# Patient Record
Sex: Female | Born: 1970 | Race: White | Hispanic: No | Smoking: Never smoker
Health system: Southern US, Community
[De-identification: ages and names within clinical notes are randomized; demographics above are authoritative.]

## PROBLEM LIST (undated history)

## (undated) DIAGNOSIS — R011 Cardiac murmur, unspecified: Secondary | ICD-10-CM

## (undated) DIAGNOSIS — I82409 Acute embolism and thrombosis of unspecified deep veins of unspecified lower extremity: Secondary | ICD-10-CM

## (undated) DIAGNOSIS — D649 Anemia, unspecified: Secondary | ICD-10-CM

## (undated) DIAGNOSIS — D219 Benign neoplasm of connective and other soft tissue, unspecified: Secondary | ICD-10-CM

## (undated) HISTORY — PX: SHOULDER SURGERY: SHX246

## (undated) HISTORY — PX: BLADDER SUSPENSION: SHX72

## (undated) HISTORY — PX: LEEP: SHX91

---

## 1998-03-15 ENCOUNTER — Inpatient Hospital Stay (HOSPITAL_COMMUNITY): Admission: AD | Admit: 1998-03-15 | Discharge: 1998-03-16 | Payer: Self-pay | Admitting: *Deleted

## 2002-02-02 ENCOUNTER — Other Ambulatory Visit: Admission: RE | Admit: 2002-02-02 | Discharge: 2002-02-02 | Payer: Self-pay | Admitting: Obstetrics and Gynecology

## 2002-10-30 ENCOUNTER — Other Ambulatory Visit: Admission: RE | Admit: 2002-10-30 | Discharge: 2002-10-30 | Payer: Self-pay | Admitting: Obstetrics and Gynecology

## 2003-06-28 ENCOUNTER — Encounter: Admission: RE | Admit: 2003-06-28 | Discharge: 2003-06-28 | Payer: Self-pay | Admitting: Obstetrics and Gynecology

## 2003-07-04 ENCOUNTER — Encounter: Admission: RE | Admit: 2003-07-04 | Discharge: 2003-07-04 | Payer: Self-pay | Admitting: Obstetrics and Gynecology

## 2003-07-08 ENCOUNTER — Ambulatory Visit (HOSPITAL_COMMUNITY): Admission: RE | Admit: 2003-07-08 | Discharge: 2003-07-08 | Payer: Self-pay | Admitting: Obstetrics and Gynecology

## 2003-07-08 ENCOUNTER — Inpatient Hospital Stay (HOSPITAL_COMMUNITY): Admission: AD | Admit: 2003-07-08 | Discharge: 2003-07-12 | Payer: Self-pay | Admitting: Obstetrics and Gynecology

## 2003-07-08 ENCOUNTER — Encounter: Payer: Self-pay | Admitting: Obstetrics and Gynecology

## 2003-07-12 ENCOUNTER — Encounter: Admission: RE | Admit: 2003-07-12 | Discharge: 2003-08-11 | Payer: Self-pay | Admitting: Obstetrics and Gynecology

## 2003-08-12 ENCOUNTER — Encounter: Admission: RE | Admit: 2003-08-12 | Discharge: 2003-09-11 | Payer: Self-pay | Admitting: Obstetrics and Gynecology

## 2003-10-12 ENCOUNTER — Encounter: Admission: RE | Admit: 2003-10-12 | Discharge: 2003-11-11 | Payer: Self-pay | Admitting: Obstetrics and Gynecology

## 2003-11-07 ENCOUNTER — Other Ambulatory Visit: Admission: RE | Admit: 2003-11-07 | Discharge: 2003-11-07 | Payer: Self-pay | Admitting: Obstetrics and Gynecology

## 2004-09-04 ENCOUNTER — Other Ambulatory Visit: Admission: RE | Admit: 2004-09-04 | Discharge: 2004-09-04 | Payer: Self-pay | Admitting: Gynecology

## 2004-09-08 ENCOUNTER — Encounter: Admission: RE | Admit: 2004-09-08 | Discharge: 2004-09-08 | Payer: Self-pay | Admitting: Gynecology

## 2005-05-25 ENCOUNTER — Other Ambulatory Visit: Admission: RE | Admit: 2005-05-25 | Discharge: 2005-05-25 | Payer: Self-pay | Admitting: Gynecology

## 2005-11-09 ENCOUNTER — Other Ambulatory Visit: Admission: RE | Admit: 2005-11-09 | Discharge: 2005-11-09 | Payer: Self-pay | Admitting: Gynecology

## 2006-11-29 ENCOUNTER — Other Ambulatory Visit: Admission: RE | Admit: 2006-11-29 | Discharge: 2006-11-29 | Payer: Self-pay | Admitting: Gynecology

## 2007-05-20 ENCOUNTER — Emergency Department (HOSPITAL_COMMUNITY): Admission: EM | Admit: 2007-05-20 | Discharge: 2007-05-20 | Payer: Self-pay | Admitting: Emergency Medicine

## 2010-10-18 ENCOUNTER — Encounter: Payer: Self-pay | Admitting: *Deleted

## 2011-02-12 NOTE — Op Note (Signed)
NAME:  Patricia Pugh, Patricia Pugh                           ACCOUNT NO.:  [id number]   MEDICAL RECORD NO.:  [id number]                   PATIENT TYPE:  INP   LOCATION:  9168                                 FACILITY:  [facility]   PHYSICIAN:  Charles A. Sydnee Cabal, MD            DATE OF BIRTH:  Oct 20, 1970   DATE OF PROCEDURE:  07/10/2003  DATE OF DISCHARGE:                                 OPERATIVE REPORT   DELIVERY NOTE:  A 40 year old para 3-0-0-3, [facility] July 14, 2003, by 15.5  week ultrasound, who was admitted on July 08, 2003, for Cervidil  induction at 39 weeks 2 days estimated gestational age secondary to  worsening hypertension.  She has been watched at modified bed rest for the  last two to three weeks with blood pressures in the 130-140s/90s.  The day  prior to admission she had blood pressure 150/100 and repeated so no  proteinuria was noted.  PIH labs were normal last week as well as day of  admission.  Results were reviewed and 24-hour urine protein was 105 mg.  She  has complicating factor of migraine headache, long history, chronic,  requiring Vicodin, and questionable scalp injections with a neurologist.  She has had a headache for the last several weeks typical for her migraines.  No scotomata, right upper quadrant pain are noted.  She notes active fetal  movement and some contractions, which were mild after Cervidil had been in  place since 0100.  She denied rupture of membranes or bleeding.  She is Rh  positive, rubella immune, triple screen normal, group B strep negative, one-  hour Glucola 111, hemoglobin 8.9 on b.i.d. Chromagen Forte.  NST was  reactive.  AFI was normal yesterday, the day prior to admission.  Ultrasound  on October 5 showed estimated fetal weight of 7 pounds 13 ounces.   PAST MEDICAL HISTORY:  Migraine headache.   PAST SURGICAL HISTORY:  SVD x3.   MEDICATIONS:  1. Vicodin p.r.n.  2. Chromagen Forte b.i.d.   ALLERGIES:  No known drug allergies.   SOCIAL  HISTORY:  No tobacco, ethanol, or drug use.  The patient is married.   FAMILY HISTORY:  Unrelated.   PHYSICAL EXAMINATION:  VITAL SIGNS:  Fetal heart rate is 140s, reactive,  without decelerations.  Blood pressure 150-170/90-100.  Contractions were  every six to eight minutes and mild.  Cervix per R.N. was 1 cm and thick.  Vertex on AFI yesterday was noted at ultrasound.  HEENT:  Normal.  CHEST:  Lungs clear.  CARDIAC:  Regular rate and rhythm.  ABDOMEN:  Gravid, fundal height 39 cm.  Estimated fetal weight 8 pounds.  PELVIC:  Cervix was not checked.  EXTREMITIES:  Moderate edema and deep tendon reflexes 1-2+.   ADMISSION LABORATORY DATA:  Hemoglobin 9.8 with hematocrit 28.6, platelets  170, 8.5 white blood cells.   Cervidil was discontinued at noon and urine was  dipped and was noted to be  negative for protein.  Blood pressures continued to be somewhat of concern  with 150s/90s, occasional 160/100, but nonsustained.  Deep tendon reflexes  remained 1+.  Headache waxed and waned during this admission.  Relief would  come from p.o. Vicodin.  She always denied scotomata or right upper quadrant  pain.  She always felt this was a typical migraine headache, bitemporal in  location, which was her chronic type of headache.  PIH labs were checked  again the morning of day 2 of induction.  She had progressed on with little  change and after Cervidil, Pitocin was started.  Pitocin was continued up  until about 11 p.m., at which time Pitocin was lowered to 4 milli-  international units per minute for overnight low-dose Pitocin.  The patient  was able to rest through the night.  Headache responded to Vicodin.  She  again denied scotomata or right upper quadrant pain.  Deep tendon reflexes  remained 1+, and it was felt the hypertension was stable.  Consideration was  given to magnesium prophylaxis but with no protein and blood pressures not  severe in nature on a sustained level and mainly felt  that this patient's  headaches were migraines, I did not want to start magnesium until I could  get the patient into active labor.  At 12:20 on day 2, cervix was 3 cm  dilated, 90% effaced, and -1 station with a bulging bag of water.  Fetal  heart rate was 140, reactive without decelerations except occasional mild  variable.  Contractions were every two to three minutes.  Pitocin was at 36  milli-international units per minute.  Blood pressure was 153/94.  No  protein was noted in the urine, which had been sent to the lab.  PIH labs  were repeated this morning and were noted to be normal.  She progressed on  to become completely dilated at 1432, had spontaneous vaginal delivery at  1447, and delivery of placenta at 1455.  She had spontaneous vaginal  delivery of a vigorous female, Apgars 8 and 9.  Father of the baby cut the  cord.  There was a small second degree midline laceration noted.  This was  repaired with 3-0 Vicryl and local anesthetic.  The placenta was  spontaneous, three-vessel, and intact.  Nuchal cord x1 was delivered  through.  Estimated blood loss 400 mL.  Mother and baby were recovering  stably at this time.   COMPLICATIONS:  1. Anemia.  2. Pregnancy-induced hypertension.  3. Migraine headaches.  4. Nuchal cord.   PROCEDURES:  1. Cervidil placement.  2. Pitocin induction.  3. Artificial rupture of membranes.  4. Spontaneous vaginal delivery.  5. Magnesium prophylaxis.                                                 Charles A. Sydnee Cabal, MD    CAD/MEDQ  D:  07/10/2003  T:  07/10/2003  Job:  [id number]

## 2011-07-09 LAB — RAPID STREP SCREEN (MED CTR MEBANE ONLY): Streptococcus, Group A Screen (Direct): NEGATIVE

## 2011-10-04 ENCOUNTER — Other Ambulatory Visit (HOSPITAL_COMMUNITY)
Admission: RE | Admit: 2011-10-04 | Discharge: 2011-10-04 | Disposition: A | Discharge status: Home / Self Care | Payer: Self-pay | Source: Ambulatory Visit | Attending: Obstetrics and Gynecology | Admitting: Obstetrics and Gynecology

## 2011-10-04 ENCOUNTER — Other Ambulatory Visit: Payer: Self-pay | Admitting: Obstetrics and Gynecology

## 2011-10-04 ENCOUNTER — Other Ambulatory Visit (HOSPITAL_COMMUNITY)
Admission: RE | Admit: 2011-10-04 | Discharge: 2011-10-04 | Disposition: A | Discharge status: Home / Self Care | Payer: BC Managed Care – PPO | Source: Ambulatory Visit | Attending: Obstetrics and Gynecology | Admitting: Obstetrics and Gynecology

## 2011-10-04 DIAGNOSIS — Z113 Encounter for screening for infections with a predominantly sexual mode of transmission: Secondary | ICD-10-CM | POA: Insufficient documentation

## 2011-10-04 DIAGNOSIS — Z01419 Encounter for gynecological examination (general) (routine) without abnormal findings: Secondary | ICD-10-CM | POA: Insufficient documentation

## 2012-04-10 ENCOUNTER — Other Ambulatory Visit (HOSPITAL_COMMUNITY)
Admission: RE | Admit: 2012-04-10 | Discharge: 2012-04-10 | Disposition: A | Discharge status: Home / Self Care | Payer: BC Managed Care – PPO | Source: Ambulatory Visit | Attending: Obstetrics and Gynecology | Admitting: Obstetrics and Gynecology

## 2012-04-10 ENCOUNTER — Other Ambulatory Visit: Payer: Self-pay | Admitting: Obstetrics and Gynecology

## 2012-04-10 DIAGNOSIS — Z01419 Encounter for gynecological examination (general) (routine) without abnormal findings: Secondary | ICD-10-CM | POA: Insufficient documentation

## 2012-04-10 DIAGNOSIS — Z1151 Encounter for screening for human papillomavirus (HPV): Secondary | ICD-10-CM | POA: Insufficient documentation

## 2012-05-11 ENCOUNTER — Emergency Department (HOSPITAL_BASED_OUTPATIENT_CLINIC_OR_DEPARTMENT_OTHER)
Admission: EM | Admit: 2012-05-11 | Discharge: 2012-05-12 | Disposition: A | Discharge status: Home / Self Care | Payer: BC Managed Care – PPO | Attending: Emergency Medicine | Admitting: Emergency Medicine

## 2012-05-11 ENCOUNTER — Encounter (HOSPITAL_BASED_OUTPATIENT_CLINIC_OR_DEPARTMENT_OTHER): Payer: Self-pay | Admitting: *Deleted

## 2012-05-11 DIAGNOSIS — R112 Nausea with vomiting, unspecified: Secondary | ICD-10-CM | POA: Insufficient documentation

## 2012-05-11 DIAGNOSIS — R10811 Right upper quadrant abdominal tenderness: Secondary | ICD-10-CM | POA: Insufficient documentation

## 2012-05-11 DIAGNOSIS — R109 Unspecified abdominal pain: Secondary | ICD-10-CM | POA: Insufficient documentation

## 2012-05-11 NOTE — ED Notes (Signed)
Pt. Reports she vomited in [employer] and reports she is nauseated and when asked any diarrhea she states "yes once at home when I came home from [employer]."

## 2012-05-12 ENCOUNTER — Emergency Department (HOSPITAL_BASED_OUTPATIENT_CLINIC_OR_DEPARTMENT_OTHER): Payer: BC Managed Care – PPO

## 2012-05-12 ENCOUNTER — Ambulatory Visit (HOSPITAL_BASED_OUTPATIENT_CLINIC_OR_DEPARTMENT_OTHER)
Admit: 2012-05-12 | Discharge: 2012-05-12 | Disposition: A | Discharge status: Home / Self Care | Payer: BC Managed Care – PPO | Attending: Emergency Medicine | Admitting: Emergency Medicine

## 2012-05-12 ENCOUNTER — Encounter (HOSPITAL_BASED_OUTPATIENT_CLINIC_OR_DEPARTMENT_OTHER): Payer: Self-pay | Admitting: *Deleted

## 2012-05-12 LAB — CBC WITH DIFFERENTIAL/PLATELET
Basophils Absolute: 0 10*3/uL (ref 0.0–0.1)
HCT: 42.1 % (ref 36.0–46.0)
Lymphocytes Relative: 9 % — ABNORMAL LOW (ref 12–46)
Monocytes Absolute: 0.7 10*3/uL (ref 0.1–1.0)
Neutro Abs: 9.2 10*3/uL — ABNORMAL HIGH (ref 1.7–7.7)
Platelets: 293 10*3/uL (ref 150–400)
RBC: 4.94 MIL/uL (ref 3.87–5.11)
RDW: 12.4 % (ref 11.5–15.5)
WBC: 10.9 10*3/uL — ABNORMAL HIGH (ref 4.0–10.5)

## 2012-05-12 LAB — URINALYSIS, ROUTINE W REFLEX MICROSCOPIC
Bilirubin Urine: NEGATIVE
Ketones, ur: NEGATIVE mg/dL
Nitrite: NEGATIVE
Protein, ur: 30 mg/dL — AB
pH: 8.5 — ABNORMAL HIGH (ref 5.0–8.0)

## 2012-05-12 LAB — COMPREHENSIVE METABOLIC PANEL
ALT: 175 U/L — ABNORMAL HIGH (ref 0–35)
AST: 225 U/L — ABNORMAL HIGH (ref 0–37)
CO2: 26 mEq/L (ref 19–32)
Chloride: 100 mEq/L (ref 96–112)
GFR calc non Af Amer: >90 mL/min (ref >90)
Sodium: 137 mEq/L (ref 135–145)
Total Bilirubin: 0.7 mg/dL (ref 0.3–1.2)

## 2012-05-12 LAB — URINE MICROSCOPIC-ADD ON

## 2012-05-12 MED ORDER — HYDROCODONE-ACETAMINOPHEN 5-325 MG PO TABS
2.0000 | ORAL_TABLET | Freq: Once | ORAL | Status: AC
  Administered 2012-05-12: 2 via ORAL
  Filled 2012-05-12: qty 2

## 2012-05-12 MED ORDER — IOHEXOL 300 MG/ML  SOLN
100.0000 mL | Freq: Once | INTRAMUSCULAR | Status: AC | PRN
  Administered 2012-05-12: 100 mL via INTRAVENOUS

## 2012-05-12 MED ORDER — IOHEXOL 300 MG/ML  SOLN
20.0000 mL | Freq: Once | INTRAMUSCULAR | Status: AC | PRN
  Administered 2012-05-12: 20 mL via INTRAVENOUS

## 2012-05-12 MED ORDER — HYDROMORPHONE HCL PF 1 MG/ML IJ SOLN
1.0000 mg | Freq: Once | INTRAMUSCULAR | Status: AC
  Administered 2012-05-12: 1 mg via INTRAVENOUS
  Filled 2012-05-12: qty 1

## 2012-05-12 MED ORDER — SODIUM CHLORIDE 0.9 % IV SOLN
INTRAVENOUS | Status: DC
  Administered 2012-05-12: 01:00:00 via INTRAVENOUS

## 2012-05-12 MED ORDER — ONDANSETRON HCL 4 MG/2ML IJ SOLN
4.0000 mg | Freq: Once | INTRAMUSCULAR | Status: AC
  Administered 2012-05-12: 4 mg via INTRAVENOUS
  Filled 2012-05-12: qty 2

## 2012-05-12 NOTE — ED Provider Notes (Signed)
History     CSN: [id number]  Arrival date & time 05/11/12  2320   First MD Initiated Contact with Patient 05/12/12 [phone]      Chief Complaint  Patient presents with  . Abdominal Pain        (Consider location/radiation/quality/duration/timing/severity/associated sxs/prior treatment) HPI This is a 41 year old white female who had the sudden onset of diffuse abdominal pain about 6 PM yesterday evening. The pain has steadily worsened and is moderate to severe now. It is worse with movement or palpation. She is having difficulty finding a comfortable position. She poorly characterized the pain is unlike any pain she's experienced in the past. She has had retching and nausea and one episode of diarrhea. She's not aware of having any fever but she has felt alternately hot and cold. She still has her gallbladder.  No past medical history on file.  Past Surgical History  Procedure Date  . Shoulder surgery     Right  . Bladder suspension     No family history on file.  History  Substance Use Topics  . Smoking status: Never Smoker   . Smokeless tobacco: Not on file  . Alcohol Use: No    OB History    Grav Para Term Preterm Abortions TAB SAB Ect Mult Living                  Review of Systems  All other systems reviewed and are negative.    Allergies  Review of patient's allergies indicates no known allergies.  Home Medications   Current Outpatient Rx  Name Route Sig Dispense Refill  . ASPIRIN-ACETAMINOPHEN-CAFFEINE 250-250-65 MG PO TABS Oral Take 1 tablet by mouth every 6 (six) hours as needed.      BP 133/71  Pulse 86  Temp 98.7 F (37.1 C) (Oral)  Resp 16  Ht 5\' 8"  (1.727 m)  Wt 204 lb (92.534 kg)  BMI 31.02 kg/m2  SpO2 100%  LMP 05/02/2012  Physical Exam General: Well-developed, well-nourished female in no acute distress; appearance consistent with age of record HENT: normocephalic, atraumatic Eyes: pupils equal round and reactive to light; extraocular  muscles intact Neck: supple Heart: regular rate and rhythm Lungs: clear to auscultation bilaterally Abdomen: soft; nondistended; right upper quadrant tenderness; no masses or hepatosplenomegaly; bowel sounds hypoactive; no gallstone seen on bedside ultrasound but there was a positive sonographic Murphy sign Extremities: No deformity; full range of motion Neurologic: Awake, alert and oriented; motor function intact in all extremities and symmetric; no facial droop Skin: Warm and dry     ED Course  Procedures (including critical care time)     MDM   Nursing notes and vitals signs, including pulse oximetry, reviewed.  Summary of this visit's results, reviewed by myself:  Labs:  Results for orders placed during the hospital encounter of 05/11/12  URINALYSIS, ROUTINE W REFLEX MICROSCOPIC      Component Value Range   Color, Urine YELLOW  YELLOW   APPearance CLOUDY (*) CLEAR   Specific Gravity, Urine 1.021  1.005 - 1.030   pH 8.5 (*) 5.0 - 8.0   Glucose, UA NEGATIVE  NEGATIVE mg/dL   Hgb urine dipstick NEGATIVE  NEGATIVE   Bilirubin Urine NEGATIVE  NEGATIVE   Ketones, ur NEGATIVE  NEGATIVE mg/dL   Protein, ur 30 (*) NEGATIVE mg/dL   Urobilinogen, UA 1.0  0.0 - 1.0 mg/dL   Nitrite NEGATIVE  NEGATIVE   Leukocytes, UA NEGATIVE  NEGATIVE  PREGNANCY, URINE  Component Value Range   Preg Test, Ur NEGATIVE  NEGATIVE  URINE MICROSCOPIC-ADD ON      Component Value Range   Squamous Epithelial / LPF RARE  RARE   WBC, UA 0-2  <3 WBC/hpf   RBC / HPF 0-2  <3 RBC/hpf   Bacteria, UA RARE  RARE   Urine-Other AMORPHOUS URATES/PHOSPHATES    CBC WITH DIFFERENTIAL      Component Value Range   WBC 10.9 (*) 4.0 - 10.5 K/uL   RBC 4.94  3.87 - 5.11 MIL/uL   Hemoglobin 14.4  12.0 - 15.0 g/dL   HCT 16.1  09.6 - 04.5 %   MCV 85.2  78.0 - 100.0 fL   MCH 29.1  26.0 - 34.0 pg   MCHC 34.2  30.0 - 36.0 g/dL   RDW 40.9  81.1 - 91.4 %   Platelets 293  150 - 400 K/uL   Neutrophils Relative 85  (*) 43 - 77 %   Neutro Abs 9.2 (*) 1.7 - 7.7 K/uL   Lymphocytes Relative 9 (*) 12 - 46 %   Lymphs Abs 1.0  0.7 - 4.0 K/uL   Monocytes Relative 6  3 - 12 %   Monocytes Absolute 0.7  0.1 - 1.0 K/uL   Eosinophils Relative 0  0 - 5 %   Eosinophils Absolute 0.0  0.0 - 0.7 K/uL   Basophils Relative 0  0 - 1 %   Basophils Absolute 0.0  0.0 - 0.1 K/uL  COMPREHENSIVE METABOLIC PANEL      Component Value Range   Sodium 137  135 - 145 mEq/L   Potassium 3.8  3.5 - 5.1 mEq/L   Chloride 100  96 - 112 mEq/L   CO2 26  19 - 32 mEq/L   Glucose, Bld 151 (*) 70 - 99 mg/dL   BUN 14  6 - 23 mg/dL   Creatinine, Ser 7.82  0.50 - 1.10 mg/dL   Calcium 9.4  8.4 - 95.6 mg/dL   Total Protein 8.1  6.0 - 8.3 g/dL   Albumin 4.5  3.5 - 5.2 g/dL   AST 213 (*) 0 - 37 U/L   ALT 175 (*) 0 - 35 U/L   Alkaline Phosphatase 84  39 - 117 U/L   Total Bilirubin 0.7  0.3 - 1.2 mg/dL   GFR calc non Af Amer >90  >90 mL/min   GFR calc Af Amer >90  >90 mL/min  LIPASE, BLOOD      Component Value Range   Lipase 22  11 - 59 U/L    Imaging Studies: Ct Abdomen Pelvis W Contrast  05/12/2012  *RADIOLOGY REPORT*  Clinical Data: Abdominal pain.  Nausea.  Vomiting.  CT ABDOMEN AND PELVIS WITH CONTRAST  Technique:  Multidetector CT imaging of the abdomen and pelvis was performed following the standard protocol during bolus administration of intravenous contrast.  Contrast: 20mL OMNIPAQUE IOHEXOL 300 MG/ML  SOLN, OMNIPAQUE IOHEXOL 300 MG/ML  SOLN  Comparison: None.  Findings: Lung Bases: Dependent atelectasis at the lung bases.  Liver:  Probable fatty liver.  Tiny nonspecific low density lesion in the right hepatic dome.  No biliary ductal dilation.  Spleen:  Normal.  Gallbladder:  Normal.  Common bile duct:  Normal.  Pancreas:  Normal.  Adrenal glands:  Normal.  Kidneys:  Normal enhancement. Tiny sub-centimeter low density lesion in the right interpolar kidney probably represents a cyst but is too small to characterize.  Stomach:   Distended with  oral contrast.  No inflammatory changes.  Small bowel:  Normal.  No adenopathy or inflammatory changes.  No obstruction.  Colon:   Normal appendix.  Prominent right-sided stool burden. Decompressed distal colon.  Pelvic Genitourinary:  Physiologic appearance of the uterus and adnexa.  Urinary bladder normal.  Bones:  No aggressive osseous lesions.  Vasculature: Normal.  IMPRESSION: No acute abnormality.  Probable fatty liver.  Original Report Authenticated By: Andreas Newport, M.D.   2:51 AM Patient with minimal right upper quadrant tenderness. Space states she feels significantly better after IV meds. She has not required any repeat doses of IV analgesic. The location of the pain is still suspicious for biliary etiology. We will have the patient return later this morning for an ultrasound.         Hanley Seamen, MD 05/12/12 [phone]

## 2012-05-12 NOTE — ED Notes (Signed)
Taken pt to [facility]

## 2016-11-24 ENCOUNTER — Other Ambulatory Visit: Payer: Self-pay | Admitting: Obstetrics and Gynecology

## 2016-11-24 ENCOUNTER — Other Ambulatory Visit (HOSPITAL_COMMUNITY)
Admission: RE | Admit: 2016-11-24 | Discharge: 2016-11-24 | Disposition: A | Discharge status: Home / Self Care | Payer: 59 | Source: Ambulatory Visit | Attending: Obstetrics and Gynecology | Admitting: Obstetrics and Gynecology

## 2016-11-24 DIAGNOSIS — R0602 Shortness of breath: Secondary | ICD-10-CM | POA: Diagnosis not present

## 2016-11-24 DIAGNOSIS — Z01411 Encounter for gynecological examination (general) (routine) with abnormal findings: Secondary | ICD-10-CM | POA: Diagnosis not present

## 2016-11-24 DIAGNOSIS — N644 Mastodynia: Secondary | ICD-10-CM

## 2016-11-24 DIAGNOSIS — N946 Dysmenorrhea, unspecified: Secondary | ICD-10-CM | POA: Diagnosis not present

## 2016-11-24 DIAGNOSIS — R87810 Cervical high risk human papillomavirus (HPV) DNA test positive: Secondary | ICD-10-CM | POA: Insufficient documentation

## 2016-11-24 DIAGNOSIS — Z1151 Encounter for screening for human papillomavirus (HPV): Secondary | ICD-10-CM | POA: Diagnosis present

## 2016-11-24 DIAGNOSIS — Z01419 Encounter for gynecological examination (general) (routine) without abnormal findings: Secondary | ICD-10-CM | POA: Diagnosis not present

## 2016-11-24 DIAGNOSIS — N921 Excessive and frequent menstruation with irregular cycle: Secondary | ICD-10-CM | POA: Diagnosis not present

## 2016-11-29 ENCOUNTER — Ambulatory Visit
Admission: RE | Admit: 2016-11-29 | Discharge: 2016-11-29 | Disposition: A | Discharge status: Home / Self Care | Payer: 59 | Source: Ambulatory Visit | Attending: Obstetrics and Gynecology | Admitting: Obstetrics and Gynecology

## 2016-11-29 ENCOUNTER — Other Ambulatory Visit: Payer: Self-pay | Admitting: Obstetrics and Gynecology

## 2016-11-29 DIAGNOSIS — N644 Mastodynia: Secondary | ICD-10-CM

## 2016-11-29 DIAGNOSIS — R922 Inconclusive mammogram: Secondary | ICD-10-CM | POA: Diagnosis not present

## 2016-11-29 DIAGNOSIS — N6320 Unspecified lump in the left breast, unspecified quadrant: Secondary | ICD-10-CM

## 2016-11-29 DIAGNOSIS — N6489 Other specified disorders of breast: Secondary | ICD-10-CM | POA: Diagnosis not present

## 2016-11-29 LAB — CYTOLOGY - PAP
Diagnosis: NEGATIVE
HPV (WINDOPATH): DETECTED — AB
HPV 16/18/45 GENOTYPING: NEGATIVE

## 2016-11-30 ENCOUNTER — Other Ambulatory Visit: Payer: Self-pay | Admitting: Physician Assistant

## 2016-11-30 ENCOUNTER — Ambulatory Visit
Admission: RE | Admit: 2016-11-30 | Discharge: 2016-11-30 | Disposition: A | Discharge status: Home / Self Care | Payer: 59 | Source: Ambulatory Visit | Attending: Physician Assistant | Admitting: Physician Assistant

## 2016-11-30 DIAGNOSIS — R0602 Shortness of breath: Secondary | ICD-10-CM | POA: Diagnosis not present

## 2016-11-30 DIAGNOSIS — R05 Cough: Secondary | ICD-10-CM | POA: Diagnosis not present

## 2016-11-30 DIAGNOSIS — D649 Anemia, unspecified: Secondary | ICD-10-CM | POA: Diagnosis not present

## 2016-12-21 ENCOUNTER — Other Ambulatory Visit: Payer: Self-pay | Admitting: Obstetrics and Gynecology

## 2016-12-21 DIAGNOSIS — Z86718 Personal history of other venous thrombosis and embolism: Secondary | ICD-10-CM | POA: Diagnosis not present

## 2016-12-21 DIAGNOSIS — N921 Excessive and frequent menstruation with irregular cycle: Secondary | ICD-10-CM | POA: Diagnosis not present

## 2016-12-21 DIAGNOSIS — N840 Polyp of corpus uteri: Secondary | ICD-10-CM | POA: Diagnosis not present

## 2017-01-04 DIAGNOSIS — N921 Excessive and frequent menstruation with irregular cycle: Secondary | ICD-10-CM | POA: Diagnosis not present

## 2017-01-04 DIAGNOSIS — N840 Polyp of corpus uteri: Secondary | ICD-10-CM | POA: Diagnosis not present

## 2017-01-04 DIAGNOSIS — Z86718 Personal history of other venous thrombosis and embolism: Secondary | ICD-10-CM | POA: Diagnosis not present

## 2017-01-31 DIAGNOSIS — D649 Anemia, unspecified: Secondary | ICD-10-CM | POA: Diagnosis not present

## 2017-04-25 DIAGNOSIS — N920 Excessive and frequent menstruation with regular cycle: Secondary | ICD-10-CM | POA: Diagnosis not present

## 2017-08-17 DIAGNOSIS — Z23 Encounter for immunization: Secondary | ICD-10-CM | POA: Diagnosis not present

## 2019-01-15 IMAGING — DX DG CHEST 2V
2 series · 2 of 2 positions shown · non-contrast
Comparison: None.

CLINICAL DATA: Cough with posterior right back pain for 2 months.
Nonsmoker.

EXAM:
CHEST  2 VIEW

[dg chest 2 view (1 of 2)]
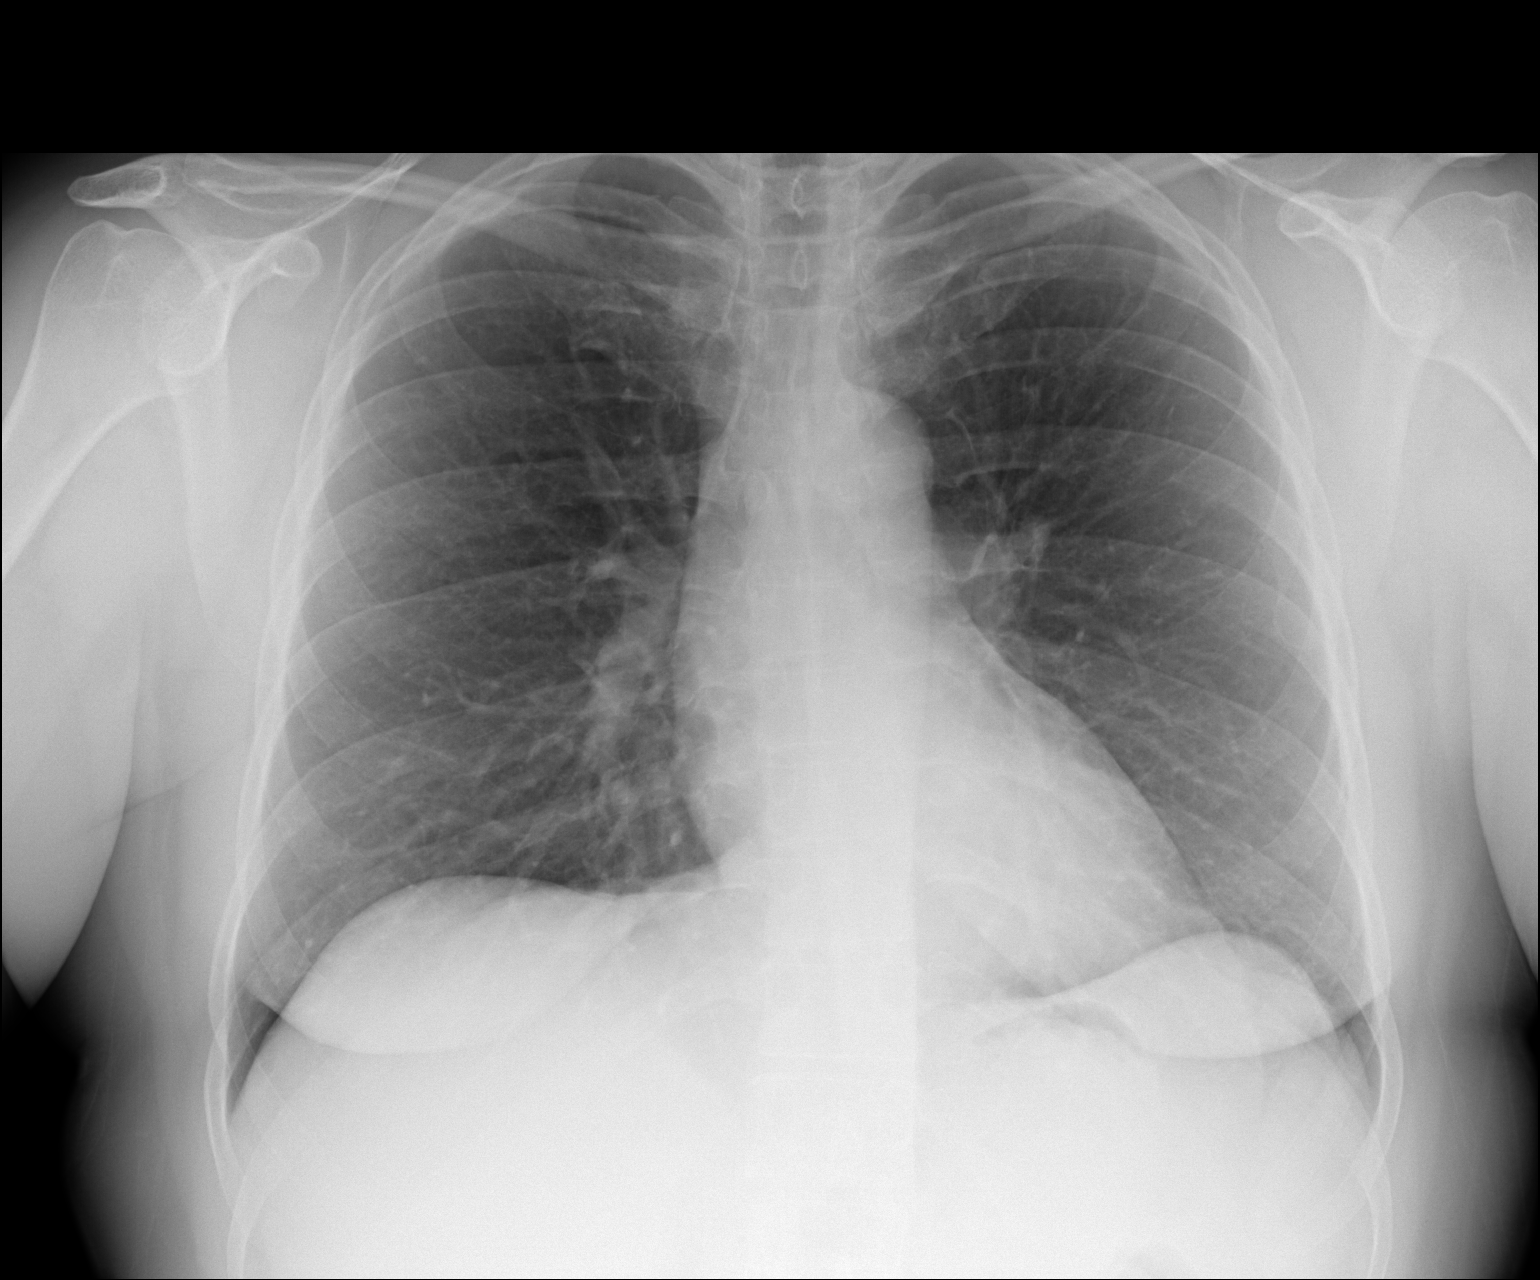

[dg chest 2 view (2 of 2)]
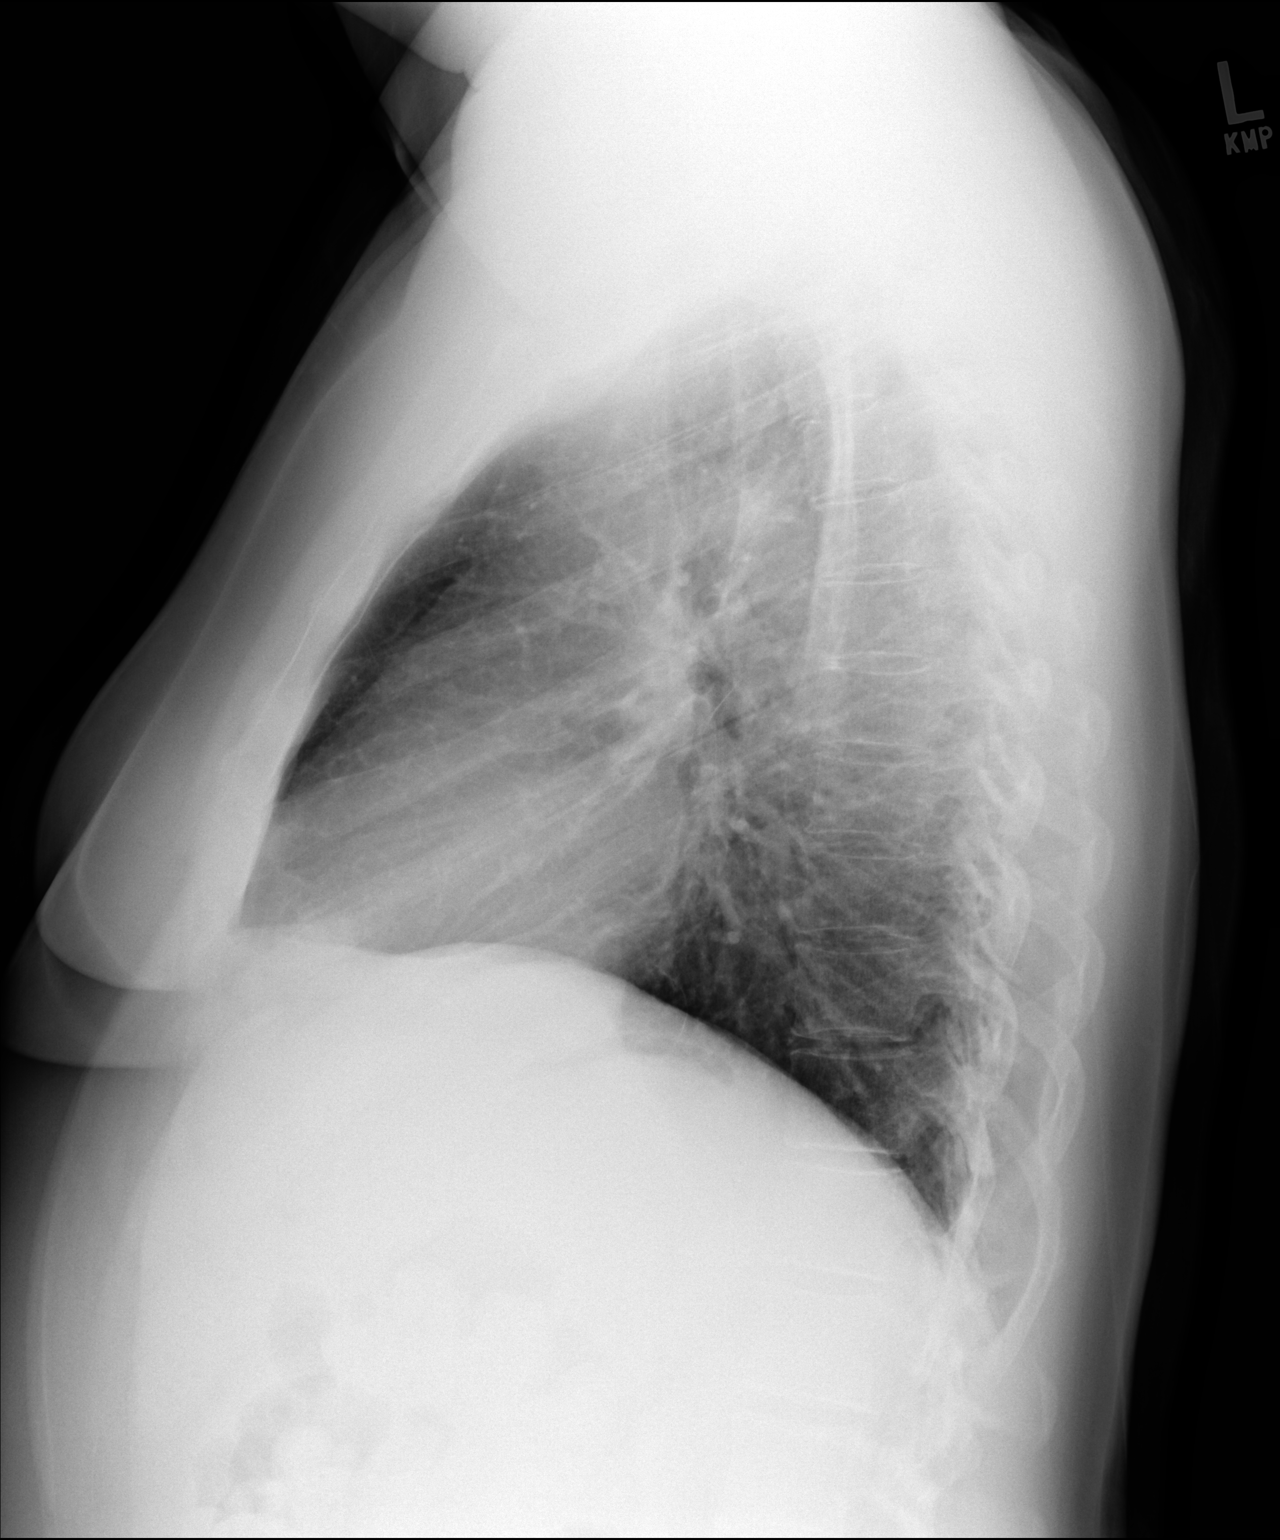

[2 of 2 positions shown; findings below may reference images not displayed]

FINDINGS: The heart size and mediastinal contours are normal. The lungs are
clear. There is no pleural effusion or pneumothorax. No acute
osseous findings are identified.
IMPRESSION: No active cardiopulmonary process.

## 2020-03-13 ENCOUNTER — Observation Stay (HOSPITAL_COMMUNITY)
Admission: EM | Admit: 2020-03-13 | Discharge: 2020-03-14 | Disposition: A | Discharge status: Home / Self Care | Payer: Commercial Managed Care - PPO | Attending: Family Medicine | Admitting: Family Medicine

## 2020-03-13 ENCOUNTER — Emergency Department (HOSPITAL_COMMUNITY): Payer: Commercial Managed Care - PPO

## 2020-03-13 ENCOUNTER — Observation Stay (HOSPITAL_COMMUNITY): Payer: Commercial Managed Care - PPO

## 2020-03-13 ENCOUNTER — Encounter (HOSPITAL_COMMUNITY): Payer: Self-pay | Admitting: Family Medicine

## 2020-03-13 ENCOUNTER — Other Ambulatory Visit: Payer: Self-pay

## 2020-03-13 DIAGNOSIS — R011 Cardiac murmur, unspecified: Secondary | ICD-10-CM | POA: Insufficient documentation

## 2020-03-13 DIAGNOSIS — D259 Leiomyoma of uterus, unspecified: Secondary | ICD-10-CM | POA: Diagnosis not present

## 2020-03-13 DIAGNOSIS — G43909 Migraine, unspecified, not intractable, without status migrainosus: Secondary | ICD-10-CM | POA: Diagnosis not present

## 2020-03-13 DIAGNOSIS — D500 Iron deficiency anemia secondary to blood loss (chronic): Secondary | ICD-10-CM | POA: Diagnosis not present

## 2020-03-13 DIAGNOSIS — N939 Abnormal uterine and vaginal bleeding, unspecified: Secondary | ICD-10-CM | POA: Diagnosis not present

## 2020-03-13 DIAGNOSIS — Z20822 Contact with and (suspected) exposure to covid-19: Secondary | ICD-10-CM | POA: Insufficient documentation

## 2020-03-13 DIAGNOSIS — D649 Anemia, unspecified: Secondary | ICD-10-CM | POA: Diagnosis present

## 2020-03-13 DIAGNOSIS — Z86718 Personal history of other venous thrombosis and embolism: Secondary | ICD-10-CM | POA: Diagnosis not present

## 2020-03-13 HISTORY — DX: Acute embolism and thrombosis of unspecified deep veins of unspecified lower extremity: I82.409

## 2020-03-13 HISTORY — DX: Benign neoplasm of connective and other soft tissue, unspecified: D21.9

## 2020-03-13 LAB — URINALYSIS, ROUTINE W REFLEX MICROSCOPIC
Bilirubin Urine: NEGATIVE
Glucose, UA: NEGATIVE mg/dL
Hgb urine dipstick: NEGATIVE
Ketones, ur: NEGATIVE mg/dL
Leukocytes,Ua: NEGATIVE
Nitrite: NEGATIVE
Protein, ur: NEGATIVE mg/dL
Specific Gravity, Urine: 1.018 (ref 1.005–1.030)
pH: 5 (ref 5.0–8.0)

## 2020-03-13 LAB — COMPREHENSIVE METABOLIC PANEL
ALT: 23 U/L (ref 0–44)
AST: 36 U/L (ref 15–41)
Albumin: 3.9 g/dL (ref 3.5–5.0)
Alkaline Phosphatase: 84 U/L (ref 38–126)
Anion gap: 10 (ref 5–15)
BUN: 9 mg/dL (ref 6–20)
CO2: 22 mmol/L (ref 22–32)
Calcium: 8.9 mg/dL (ref 8.9–10.3)
Chloride: 105 mmol/L (ref 98–111)
Creatinine, Ser: 0.75 mg/dL (ref 0.44–1.00)
GFR calc Af Amer: >60 mL/min (ref >60)
GFR calc non Af Amer: >60 mL/min (ref >60)
Glucose, Bld: 110 mg/dL — ABNORMAL HIGH (ref 70–99)
Potassium: 4.1 mmol/L (ref 3.5–5.1)
Sodium: 137 mmol/L (ref 135–145)
Total Bilirubin: 0.5 mg/dL (ref 0.3–1.2)
Total Protein: 7.7 g/dL (ref 6.5–8.1)

## 2020-03-13 LAB — I-STAT BETA HCG BLOOD, ED (MC, WL, AP ONLY): I-stat hCG, quantitative: <5 m[IU]/mL (ref <5)

## 2020-03-13 LAB — CBC
HCT: 21.7 % — ABNORMAL LOW (ref 36.0–46.0)
Hemoglobin: 4.9 g/dL — CL (ref 12.0–15.0)
MCH: 14.2 pg — ABNORMAL LOW (ref 26.0–34.0)
MCHC: 22.6 g/dL — ABNORMAL LOW (ref 30.0–36.0)
MCV: 63.1 fL — ABNORMAL LOW (ref 80.0–100.0)
Platelets: 813 10*3/uL — ABNORMAL HIGH (ref 150–400)
RBC: 3.44 MIL/uL — ABNORMAL LOW (ref 3.87–5.11)
RDW: 25.7 % — ABNORMAL HIGH (ref 11.5–15.5)
WBC: 8.1 10*3/uL (ref 4.0–10.5)
nRBC: 1.4 % — ABNORMAL HIGH (ref 0.0–0.2)

## 2020-03-13 LAB — WET PREP, GENITAL
Clue Cells Wet Prep HPF POC: NONE SEEN
Sperm: NONE SEEN
Trich, Wet Prep: NONE SEEN
Yeast Wet Prep HPF POC: NONE SEEN

## 2020-03-13 LAB — HIV ANTIBODY (ROUTINE TESTING W REFLEX): HIV Screen 4th Generation wRfx: NONREACTIVE

## 2020-03-13 LAB — SARS CORONAVIRUS 2 BY RT PCR (HOSPITAL ORDER, PERFORMED IN ~~LOC~~ HOSPITAL LAB): SARS Coronavirus 2: NEGATIVE

## 2020-03-13 LAB — ABO/RH: ABO/RH(D): O POS

## 2020-03-13 LAB — PREPARE RBC (CROSSMATCH)

## 2020-03-13 MED ORDER — SODIUM CHLORIDE 0.9 % IV SOLN
10.0000 mL/h | Freq: Once | INTRAVENOUS | Status: AC
  Administered 2020-03-13: 10 mL/h via INTRAVENOUS

## 2020-03-13 NOTE — Hospital Course (Signed)
See d/c summary

## 2020-03-13 NOTE — ED Triage Notes (Signed)
Pt here sent by PCP  for a blood transfusion. Heavy vaginal bleeding x 3 weeks, resolved for one week, and has now been bleeding again since last Friday.

## 2020-03-13 NOTE — H&P (Addendum)
[facility] Admission History and Physical Service Pager: [phone]  Patient name: Patricia Pugh Medical record number: [id number] Date of birth: 12-25-1970 Age: 49 y.o. Gender: female  Primary Care Provider: Lois Huxley, PA Consultants: GYN Code Status: Full Preferred Emergency Contact: sister Museum/gallery curator Complaint: low hemoglobin, abnormal vaginal bleeding     Assessment and Plan: Patricia Pugh is a 49 y.o. female presenting with fatigue and shortness of breath in the setting of asymptomatic anemia. PMH is significant for Asthma, Migraines  Symptomatic Anemia 2/2 Abnormal Uterine Bleeding  Uterine Fibroids  Patient was seen in her PCP's office yesterday for shortness of breath and fatigue. She has a history of heavy menses. Her hemoglobin from her appointment was 5.2 and MCV 55.  The patient received a call from her PCPs office instructing her to go to the emergency department.  In the ED her hemoglobin was 4.9, MCV was noted to be low at 63.  ED provider performed pelvic exam that was notable for blood in the vaginal vault and open cervical os.  Reports symptoms have been gradually worsening since 2018.  She used to walk 5 miles daily and hike often, but cannot walk many steps to her mailbox.  Most recently unable to walk to and from bathroom without becoming short of breath.  Takes a few minutes to recover.  Denies any chest pain, headaches, vision changes during these exams, but often feels faint as if she is going to pass out.  Also mentions that her heart rate jumps up to 150-160 bpm with ambulation as seen on her smart watch. Etiology likely secondary to excessive uterine bleeding. Has irregular periods that sometimes last 3 weeks and return a week later.  Has to change pad and tampon every hour. Has started to have "gushing" periods in the past 6 months.  Endorses having plum-sized clots.  Follows with Dr. Christophe Louis, gynecology, who at one point  recommended hysterectomy versus Hydro ablation for her fibroids and abnormal uterine bleeding.  Patient's vital signs stable in the emergency department, afebrile, heart rate 91, most recent blood pressure 128/59, satting 100% on room air.  Dr. Landry Mellow contacted by our team and and agrees with recommendation for Provera 10 mg daily for 10 days.  She would like patient to have 3 units PRBC with goal of hemoglobin >7 prior to her discharge.  Patient to follow-up with Dr. Landry Mellow with OB/GYN 03/20/2020.  - Admit for med-surgery observation, attending Dr. Owens Shark - Obtain US Pelvis with Transvaginal - patient declined transvaginal view - 2 unit pRBCs, given additional unit if H&H above 7 - Provera 10mg  x 10d on discharge - Follow up post transfusion H/H   - AM CBC  - Iron supplementation BID, - consider Feraheme prior to DC - Regular diet - As per unit routine - Out of bed to help with assistance    Pap Hx Per patient recent pap smear in 2018 was normal. 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   Hx of DVT  Patient with remote hx of DVT with oral contraceptives.  Per GYN (Dr.Cole)  conversation in the ED, patient should avoid hormonal therapy.  Patient reports not taking any medication to treat DVT.  Hx is Vit D def  Vit D 18 Feb 2011 and then 36 in Nov 2012.   Hx of Bladder suspension On admission, she reported she often pees especially at night due to "falling bladder".  Denies dysuria.  Glucose on admission 110.  FEN/GI: regular diet, replete electrolytes as needed  Prophylaxis: Admit for observation  Disposition:   History of Present Illness:  Patricia Pugh is a 49 y.o. female presenting with low hemoglobin.   Patient is not interested in coming into the hospital. High risk for leaving AMA.   Patient presents with shortness of breath, fatigue with walking, described feeling diffuse tingling almost like she's about to pass out, and would notice on her smart watch that her heart rate was getting  elevated to 150 or 160.  She went to her doctor's office yesterday; had an EKG that was normal and told she had a murmur.  Then 6:30pm last night she was called and told that her Hgb was really low and to go to the ED. She was watching her grandchild last night and could not come in, she came in early this morning. Hgb was even lower at 4.9, MCV 63.    Patient reports she started having the symptoms around 2018 although they were not as severe.  She was already a patient of Dr Sundra Aland and Dr Landry Mellow recommended she see a primary care physician for her symptoms of dizziness, fatigue.  Per the patient, she saw a doctor who claimed "there is nothing wrong with you" as no labs returned abnormal at the time. Many years later (yesterday) saw her new PCP Ms. Becher, [place], who drew labs on the patient and checked her hemoglobin.  Of note, patient has known fibroids, irregular cycles and hx of abnormal uterine bleeding. Patient started her current cycle on 03/07/20. Reports bleeding heavily the past 3 days but not today.   Review Of Systems: Per HPI with the following additions:   Review of Systems  Constitutional: Negative for chills, fever and weight loss.  HENT: Negative for hearing loss.   Eyes: Negative for blurred vision.  Respiratory: Positive for shortness of breath.   Cardiovascular: Negative for chest pain.  Gastrointestinal: Positive for nausea (when feeling of passing out). Negative for abdominal pain, blood in stool and vomiting.  Genitourinary: Positive for frequency. Negative for dysuria.       Ongoing vaginal bleeding, denies vaginal discharge  Musculoskeletal: Negative for falls.  Neurological: Positive for dizziness, tingling and headaches. Negative for loss of consciousness.  All other systems reviewed and are negative.   There are no problems to display for this patient.   Past Medical History: No past medical history on file.  Past Surgical History: Past Surgical History:   Procedure Laterality Date  . BLADDER SUSPENSION    . SHOULDER SURGERY     Right    Social History: Social History   Tobacco Use  . Smoking status: Never Smoker  Substance Use Topics  . Alcohol use: No  . Drug use: Not on file   Additional social history: Please also refer to relevant sections of EMR.  Family History: DAD: heart disease with BBB, has implantable device  MOM: osteoporosis    Allergies and Medications: No Known Allergies No current facility-administered medications on file prior to encounter.   Current Outpatient Medications on File Prior to Encounter  Medication Sig Dispense Refill  . aspirin-acetaminophen-caffeine (EXCEDRIN MIGRAINE) 250-250-65 MG per tablet Take 1 tablet by mouth every 6 (six) hours as needed.    . chlorpheniramine (CHLOR-TRIMETON) 4 MG tablet Take 4 mg by mouth 2 (two) times daily as needed for allergies.      Objective: BP (!) 128/56   Pulse 88   Temp  98.2 F (36.8 C) (Oral)   Resp 18   Ht 5\' 7"  (1.702 m)   Wt 96.2 kg   SpO2 98%   BMI 33.20 kg/m  Exam:  GEN:     alert, cooperative and no distress   HENT:  mucus membranes moist, oropharyngeal without lesions or erythema,  nares patent, no nasal discharge EYES:   pupils equal and reactive, EOM intact, sclera normal, no conjunctival pallor appreciated  NECK:  supple, normal ROM, no lymphadenopathy, no thyroid enlargement or nodules appreciated  RESP:  clear to auscultation bilaterally, no increased work of breathing CVS:   regular rate and rhythm, grade 2 systolic murmur present, distal pulses intact, cap refill <3 sec  ABD:  soft, mild LLQ tenderness; normal bowel sounds present; no palpable masses GU: Performed by provider Rona Ravens (see ED provider exam) EXT:   normal ROM, atraumatic, 1+ edema pitting edema, bilaterally  NEURO:  normal without focal findings,  speech normal, alert and oriented Skin:   warm and dry, no rash, Psych: Normal affect and thought content    Labs and  Imaging: CBC BMET  Recent Labs  Lab 03/13/20 0959  WBC 8.1  HGB 4.9*  HCT 21.7*  PLT 813*   Recent Labs  Lab 03/13/20 0959  NA 137  K 4.1  CL 105  CO2 22  BUN 9  CREATININE 0.75  GLUCOSE 110*  CALCIUM 8.9     EKG: NSR HR 93  No results found.   Lyndee Hensen, DO 03/13/2020, 4:05 PM PGY-1, [place] pager: [phone], text pages welcome   FPTS Upper-Level Resident Addendum I have independently interviewed and examined the patient. I have discussed the above with the original author and agree with their documentation. My edits for correction/addition/clarification are in blue. Please see also any attending notes.    Milus Banister, DO PGY-2, [facility] 03/13/2020 7:33 PM  [facility] pager: [phone] (text pages welcome through [facility])

## 2020-03-13 NOTE — Progress Notes (Addendum)
PROGRESS NOTE  Spoke with patient's OBGYN Dr. Christophe Louis. She has graciously made room on her schedule to see the patient earlier than July 20th, patient now has an appt June 24th at 4:15pm.   Per Dr. Landry Mellow, ideally patient would receive 3 units. Per our attending Dr. Owens Shark ideally patient's hemoglobin would be greater than 7 prior to discharge. We will plan to transfuse 2nd and 3rd units and then do post transfusion H&H with goal for discharge to be Hemoglobin of 7+.   Dr. Landry Mellow also agreed to providing patient with Provera 10mg  x 10 days to stop bleeding. 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 The patient is not sure what form of birth control she was on at the time. States she was never treated for this blood clot in the past. Has not had a clot since.   Will place order for Admission for Observation.     Milus Banister, [place], PGY-2 03/13/2020 5:14 PM

## 2020-03-13 NOTE — Discharge Summary (Signed)
[facility] Discharge Summary  Patient name: Patricia Pugh Medical record number: [id number] Date of birth: 1970/10/17 Age: 49 y.o. Gender: female Date of Admission: 03/13/2020  Date of Discharge: 03/14/2020 Admitting Physician: Lyndee Hensen, DO  Primary Care Provider: Lois Huxley, PA Consultants: Curbside patient's OB/GYN  Indication for Hospitalization: Symptomatic anemia secondary to vaginal bleeding  Discharge Diagnoses/Problem List:  Blood loss anemia Abnormal uterine bleed Iron deficiency anemia Migraine Systolic heart murmur  Disposition: Discharge home  Discharge Condition: Stable, improved  Discharge Exam:  Physical Exam:  General: 49 y.o. female in NAD HEENT: conjunctival pallor Cardio: RRR, systolic murmur heard best at LUSB Lungs: CTAB, no wheezing, no rhonchi, no crackles, no IWOB on RA Skin: warm and dry Neuro: A&Ox4 Psych: mood and affect appropriate for circumstance   Brief Hospital Course:  Patient presented to the emergency department the morning of 6/17, 1 day after seeing her PCP for shortness of breath and fatigue in the setting of heavy menses.  She received a call from her PCP on the evening of 6/17 requesting she go to the hospital due to abnormally low hemoglobin of 4.9.  Patient was admitted for observation.  She was transfused 3 units of PRBC.  Her posttransfusion H&H was drawn prior to discharge, but had not yet resulted when patient needed to leave for work.  The patient symptoms improved and she was deemed stable enough to return home with close outpatient follow-up.  Additionally, while hospitalized, the patient's outpatient OB/GYN was contacted who agreed to start the patient on Provera 10 x 10 days to stop the abnormal uterine bleeding.  Patient was prescribed Provera 10 x 10 days.  [name] MCV was also noted to be very low at 48 suggesting an iron deficiency anemia.  The patient was also prescribed ferrous  sulfate 325 twice daily, and was prescribed MiraLAX 1/2-1 capful daily to obtain 1 soft stool daily while on the ferrous sulfate.  Issues for Follow Up:  1. Follow-up appointment with OB/GYN March 20, 2020, at 4:15 PM 2. Contonie Provera 10 x 10 days 3. Continue ferrous sulfate 4. Check Hgb at follow up appointment 5. Will call patient with post-transfusion H&H results  Significant Procedures:  Transvaginal ultrasound - 6/17  Significant Labs and Imaging:  Recent Labs  Lab 03/13/20 0959  WBC 8.1  HGB 4.9*  HCT 21.7*  PLT 813*   Recent Labs  Lab 03/13/20 0959  NA 137  K 4.1  CL 105  CO2 22  GLUCOSE 110*  BUN 9  CREATININE 0.75  CALCIUM 8.9  ALKPHOS 84  AST 36  ALT 23  ALBUMIN 3.9    Results/Tests Pending at Time of Discharge:   Discharge Medications:  Allergies as of 03/14/2020   No Known Allergies     Medication List    TAKE these medications   aspirin-acetaminophen-caffeine 250-250-65 MG tablet Commonly known as: EXCEDRIN MIGRAINE Take 1 tablet by mouth every 6 (six) hours as needed.   Chlor-Trimeton 4 MG tablet Generic drug: chlorpheniramine Take 4 mg by mouth 2 (two) times daily as needed for allergies.   ferrous sulfate 325 (65 FE) MG tablet Take 1 tablet (325 mg total) by mouth 2 (two) times daily with a meal.   medroxyPROGESTERone 10 MG tablet Commonly known as: Provera Take 1 tablet (10 mg total) by mouth daily for 10 days.   polyethylene glycol powder 17 GM/SCOOP powder Commonly known as: GLYCOLAX/MIRALAX Take 17 g by mouth daily.  Discharge Instructions: Please refer to Patient Instructions section of EMR for full details.  Patient was counseled important signs and symptoms that should prompt return to medical care, changes in medications, dietary instructions, activity restrictions, and follow up appointments.   Follow-Up Appointments:  Follow-up Information    Christophe Louis, MD. Go on 03/20/2020.   Specialty: Obstetrics and  Gynecology Why: 04:15pm Contact information: [address] [place] [address] [phone]               Cleophas Dunker, DO 03/14/2020, 6:58 AM PGY-2, [place]

## 2020-03-13 NOTE — ED Provider Notes (Signed)
[facility] EMERGENCY DEPARTMENT Provider Note   CSN: [id number] Arrival date & time: 03/13/20  0945     History Chief Complaint  Patient presents with  . Abnormal Lab  . Vaginal Bleeding    Patricia Pugh is a 49 y.o. female.  The history is provided by the patient and medical records. No language interpreter was used.  Abnormal Lab Vaginal Bleeding    49 year old female sent here by PCP for concerns of vaginal bleeding.  Patient report since 2018, she was diagnosed with having fibroid.  She mention she has an appointment in July for possible surgical intervention such as a partial hysterectomy.  She also is mention since 2018 she has noticed progressive worsening shortness of breath and generalized fatigue.  She also endorsed having heavy vaginal bleeding usually lasting for several weeks.  For the past month she endorsed increased fatigue and shortness of breath.  States she can walk a short distance and felt very weak.  She mention having a 3-week long of heavy vaginal bleeding, with 1 week of low bleeding and now she is another week of vaginal bleeding.  She endorsed a mild lower abdominal cramping which is not unusual during these menstruation.  She denies any abnormal bleeding, or rectal bleeding.  She saw her PCP yesterday for her complaint.  Work-up was obtained including blood work and today she was notified that her hemoglobin is low and she will need to come to the ER.  She denies alcohol or tobacco use.  No known history of cancer but does have strong family history of cancer.  She is not on any blood thinner medication.  No past medical history on file.  There are no problems to display for this patient.   Past Surgical History:  Procedure Laterality Date  . BLADDER SUSPENSION    . SHOULDER SURGERY     Right     OB History   No obstetric history on file.     No family history on file.  Social History   Tobacco Use  . Smoking status: Never  Smoker  Substance Use Topics  . Alcohol use: No  . Drug use: Not on file    Home Medications Prior to Admission medications   Medication Sig Start Date End Date Taking? Authorizing Provider  aspirin-acetaminophen-caffeine (EXCEDRIN MIGRAINE) [phone] MG per tablet Take 1 tablet by mouth every 6 (six) hours as needed.    [provider]    Allergies    Patient has no known allergies.  Review of Systems   Review of Systems  Genitourinary: Positive for vaginal bleeding.  All other systems reviewed and are negative.   Physical Exam Updated Vital Signs BP (!) 148/114   Pulse (!) 119   Temp 98.2 F (36.8 C) (Oral)   Resp 18   Ht 5\' 7"  (1.702 m)   Wt 96.2 kg   SpO2 99%   BMI 33.20 kg/m   Physical Exam Vitals and nursing note reviewed.  Constitutional:      General: She is not in acute distress.    Appearance: She is well-developed. She is obese.  HENT:     Head: Atraumatic.  Eyes:     Conjunctiva/sclera: Conjunctivae normal.  Cardiovascular:     Rate and Rhythm: Tachycardia present.     Heart sounds: Murmur heard.   Pulmonary:     Effort: Pulmonary effort is normal.     Breath sounds: Normal breath sounds.  Abdominal:  General: Abdomen is flat.     Palpations: Abdomen is soft.     Tenderness: There is no abdominal tenderness.  Genitourinary:    Comments: Chaperone present during exam.  No inguinal lymphadenopathy or inguinal hernia noted.  Normal external genitalia with small amount of blood noted in the vaginal vault.  No significant discomfort with speculum insertion.  Cervical os is slightly open with dystrophic skin changes at the os.  No foreign body noted.  On bimanual examination no adnexal tenderness or cervical motion tenderness.  No significant vaginal discharge. Musculoskeletal:     Cervical back: Neck supple.  Skin:    Coloration: Skin is pale.     Findings: No rash.  Neurological:     Mental Status: She is alert and oriented to person,  place, and time.     ED Results / Procedures / Treatments   Labs (all labs ordered are listed, but only abnormal results are displayed) Labs Reviewed  WET PREP, GENITAL - Abnormal; Notable for the following components:      Result Value   WBC, Wet Prep HPF POC MANY (*)    All other components within normal limits  COMPREHENSIVE METABOLIC PANEL - Abnormal; Notable for the following components:   Glucose, Bld 110 (*)    All other components within normal limits  CBC - Abnormal; Notable for the following components:   RBC 3.44 (*)    Hemoglobin 4.9 (*)    HCT 21.7 (*)    MCV 63.1 (*)    MCH 14.2 (*)    MCHC 22.6 (*)    RDW 25.7 (*)    Platelets 813 (*)    nRBC 1.4 (*)    All other components within normal limits  SARS CORONAVIRUS 2 BY RT PCR (HOSPITAL ORDER, [place] LAB)  URINALYSIS, ROUTINE W REFLEX MICROSCOPIC  HIV ANTIBODY (ROUTINE TESTING W REFLEX)  RPR  I-STAT BETA HCG BLOOD, ED ([facility], [facility], [facility] ONLY)  TYPE AND SCREEN  ABO/RH  PREPARE RBC (CROSSMATCH)  GC/CHLAMYDIA PROBE AMP ([place]) NOT AT [facility]    EKG None  Radiology No results found.  Procedures .Critical Care Performed by: Domenic Moras, PA-C Authorized by: Domenic Moras, PA-C   Critical care provider statement:    Critical care time (minutes):  40   Critical care was time spent personally by me on the following activities:  Discussions with consultants, evaluation of patient's response to treatment, examination of patient, ordering and performing treatments and interventions, ordering and review of laboratory studies, ordering and review of radiographic studies, pulse oximetry, re-evaluation of patient's condition, obtaining history from patient or surrogate and review of old charts   (including critical care time)  Medications Ordered in ED Medications  0.9 %  sodium chloride infusion (has no administration in time range)    ED Course  I have reviewed the triage vital signs and  the nursing notes.  Pertinent labs & imaging results that were available during my care of the patient were reviewed by me and considered in my medical decision making (see chart for details).    MDM Rules/Calculators/A&P                          BP (!) 128/56   Pulse 88   Temp 98.2 F (36.8 C) (Oral)   Resp 18   Ht 5\' 7"  (1.702 m)   Wt 96.2 kg   SpO2 98%   BMI 33.20  kg/m   Final Clinical Impression(s) / ED Diagnoses Final diagnoses:  Vaginal bleeding  Symptomatic anemia    Rx / DC Orders ED Discharge Orders    None     12:34 PM Patient here with progressive worsening shortness of breath generalized fatigue and recurrent heavy menstruation which appears to be ongoing for at least 3 years.  She was seen at her PCP office yesterday for complaint blood work was obtained and she was notified that her hemoglobin was 5.2 and to come to the ER.  Today her hemoglobin is 4.9.  She does have a small amount of vaginal bleeding on exam.  Suspect anemia secondary to abnormal vaginal bleeding.  She is symptomatic, and will benefit from blood transfusion admission.  She does have a gynecologist appointment next month.  [facility] Dr. Landry Mellow  12:55 PM On pelvic examination, she does have some vaginal bleeding however it is mild at this time.  She has minimal abdominal tenderness on exam.  I have notified her OB/GYN, Dr. Landry Mellow, who recommend patient to be started on TXA, 2 pills, 3 times daily x5 days and to call the office in follow-up in 1 week.  Patient will also need to be admitted for blood transfusion.  Patient is aware of plan.  1:08 PM Dr. Landry Mellow have reviewed pt's prior chart and noted that pt has prior hx of DVT.  She recommend against any hormonal treatment and recommend against starting TXA.   3:07 PM Appreciate consultation from [facility] resident who agrees to see and admit pt for symptomatic anemia 2/2 abnormal uterine bleeding. I have ordered 2 unit of blood. covid-19 screening test  ordered.  KRISTAN BRUMMITT was evaluated in Emergency Department on 03/13/2020 for the symptoms described in the history of present illness. She was evaluated in the context of the global COVID-19 pandemic, which necessitated consideration that the patient might be at risk for infection with the SARS-CoV-2 virus that causes COVID-19. Institutional protocols and algorithms that pertain to the evaluation of patients at risk for COVID-19 are in a state of rapid change based on information released by regulatory bodies including the CDC and federal and state organizations. These policies and algorithms were followed during the patient's care in the ED.    Domenic Moras, PA-C 03/13/20 1509    Lucrezia Starch, MD 03/14/20 1743

## 2020-03-13 NOTE — ED Notes (Signed)
First attempt to call report unsuccessful. 

## 2020-03-13 NOTE — ED Notes (Signed)
Pt completed her first RBCs infusion. Pt will go to ultrasound and when she comes back we will give her the second unit of RBCs.

## 2020-03-14 LAB — BPAM RBC
Blood Product Expiration Date: 202107132359
Blood Product Expiration Date: 202107132359
Blood Product Expiration Date: 202107142359
ISSUE DATE / TIME: 202106171314
ISSUE DATE / TIME: 202106171954
ISSUE DATE / TIME: 202106172317
Unit Type and Rh: 5100
Unit Type and Rh: 5100
Unit Type and Rh: 5100

## 2020-03-14 LAB — CBC
HCT: 31.1 % — ABNORMAL LOW (ref 36.0–46.0)
Hemoglobin: 8.3 g/dL — ABNORMAL LOW (ref 12.0–15.0)
MCH: 18.6 pg — ABNORMAL LOW (ref 26.0–34.0)
MCHC: 26.7 g/dL — ABNORMAL LOW (ref 30.0–36.0)
MCV: 69.6 fL — ABNORMAL LOW (ref 80.0–100.0)
Platelets: 767 10*3/uL — ABNORMAL HIGH (ref 150–400)
RBC: 4.47 MIL/uL (ref 3.87–5.11)
RDW: 29.1 % — ABNORMAL HIGH (ref 11.5–15.5)
WBC: 7.7 10*3/uL (ref 4.0–10.5)
nRBC: 0.8 % — ABNORMAL HIGH (ref 0.0–0.2)

## 2020-03-14 LAB — TYPE AND SCREEN
ABO/RH(D): O POS
Antibody Screen: NEGATIVE
Unit division: 0
Unit division: 0
Unit division: 0

## 2020-03-14 LAB — GC/CHLAMYDIA PROBE AMP (~~LOC~~) NOT AT ARMC
Chlamydia: NEGATIVE
Comment: NEGATIVE
Comment: NORMAL
Neisseria Gonorrhea: NEGATIVE

## 2020-03-14 LAB — BASIC METABOLIC PANEL
Anion gap: 7 (ref 5–15)
BUN: 7 mg/dL (ref 6–20)
CO2: 22 mmol/L (ref 22–32)
Calcium: 8.8 mg/dL — ABNORMAL LOW (ref 8.9–10.3)
Chloride: 108 mmol/L (ref 98–111)
Creatinine, Ser: 0.67 mg/dL (ref 0.44–1.00)
GFR calc Af Amer: >60 mL/min (ref >60)
GFR calc non Af Amer: >60 mL/min (ref >60)
Glucose, Bld: 107 mg/dL — ABNORMAL HIGH (ref 70–99)
Potassium: 3.9 mmol/L (ref 3.5–5.1)
Sodium: 137 mmol/L (ref 135–145)

## 2020-03-14 LAB — RPR: RPR Ser Ql: NONREACTIVE

## 2020-03-14 MED ORDER — FERROUS SULFATE 325 (65 FE) MG PO TABS: 325.0000 mg | ORAL_TABLET | Freq: Two times a day (BID) | ORAL | 3 refills | Status: DC

## 2020-03-14 MED ORDER — MEDROXYPROGESTERONE ACETATE 10 MG PO TABS
10.0000 mg | ORAL_TABLET | Freq: Every day | ORAL | 0 refills | Status: DC
Start: 2020-03-14 — End: 2020-04-02

## 2020-03-14 MED ORDER — POLYETHYLENE GLYCOL 3350 17 GM/SCOOP PO POWD: 17.0000 g | Freq: Every day | ORAL | 0 refills | Status: DC

## 2020-03-14 NOTE — Progress Notes (Signed)
Pt discharge to home. Discharge summary given to the patient.

## 2020-03-14 NOTE — Progress Notes (Signed)
Patricia Pugh is a 49 y.o. female patient admitted from ED awake, alert - oriented  X 4 - no acute distress noted.  VSS - Blood pressure 140/77, pulse 92, temperature 98.2 F (36.8 C), temperature source Oral, resp. rate 18, height 5\' 7"  (1.702 m), weight 96.2 kg, SpO2 99 %.    IV in place, occlusive dsg intact without redness.  2nd unit of blood running thru the right AC PIV.    Will cont to eval and treat per MD orders.  Vidal Schwalbe, RN  03/13/2020 9:15PM

## 2020-03-14 NOTE — Discharge Instructions (Signed)
Thank you for letting us care for you while at [facility].  Your bleed was causing you to have "symptomatic anemia", in which you feel fatigued and shortness of breath because your hemoglobin is too low.  A normal hemoglobin for women's health around 12-13.  Your hemoglobin on arrival was 4.9.  You were given 3 units of packed red blood cells and your hemoglobin prior to discharge was drawn but had not resulted yet.  Please see below to review discharge plans and information regarding follow-up:  1.  Dr. Christophe Louis has changed your follow-up appointment to March 20, 2020 at 4:15 PM.  Please go to this appointment to follow-up for the vaginal bleeding. 2.  Your MCV (mean corpuscular volume) was very low at 63, indicating you most likely have an iron deficiency anemia.  We have prescribed an iron supplement Ferrous sulfate 325 mEq.  Take 1 tablet twice daily to boost your iron level.  Unfortunately, a side effect of this iron supplement is constipation, for which we have prescribed MiraLAX to help you to have regular bowel movements.  You can use a half cap to 1 full cap of MiraLAX daily to maintain regular bowel movements and prevent constipation. 3.  Additionally, he has been prescribed Provera, a hormone medication that can help decrease your bleeding.  Take 1 tablet of Provera 10 mg daily for the next 10 days.  It was our pleasure to serve you!  Dr. Milus Banister [facility]

## 2020-03-14 NOTE — Progress Notes (Signed)
Progress note - follow-up telephone call with patient  Patient was called this morning at [phone] with the results of her posttransfusion H&H.  Hemoglobin this morning is 8.3 (was 4.9 on admission).  Patient is feeling better.  -Patient to pick up Provera 10 mg and take 1 daily for the next 10 days -Patient to take ferrous sulfate 1 tablet twice daily to increase her iron intake -Patient also prescribed MiraLAX as needed for constipation due to the ferrous sulfate.  Was told we are here at the hospital should she need [place].  We will follow-up with her OB/GYN on Thursday, June 24 at [place], [place], PGY-2 03/14/2020 9:04 AM

## 2020-03-17 ENCOUNTER — Ambulatory Visit
Admission: RE | Admit: 2020-03-17 | Discharge: 2020-03-17 | Disposition: A | Discharge status: Home / Self Care | Payer: Commercial Managed Care - PPO | Source: Ambulatory Visit | Attending: Family Medicine | Admitting: Family Medicine

## 2020-03-17 ENCOUNTER — Other Ambulatory Visit: Payer: Self-pay | Admitting: Family Medicine

## 2020-03-17 DIAGNOSIS — R0602 Shortness of breath: Secondary | ICD-10-CM

## 2020-03-20 ENCOUNTER — Other Ambulatory Visit: Payer: Self-pay | Admitting: Obstetrics and Gynecology

## 2020-04-02 ENCOUNTER — Other Ambulatory Visit: Payer: Self-pay

## 2020-04-02 ENCOUNTER — Encounter: Payer: Self-pay | Admitting: Cardiovascular Disease

## 2020-04-02 ENCOUNTER — Ambulatory Visit: Payer: Commercial Managed Care - PPO | Admitting: Cardiovascular Disease

## 2020-04-02 DIAGNOSIS — R011 Cardiac murmur, unspecified: Secondary | ICD-10-CM | POA: Diagnosis not present

## 2020-04-02 NOTE — Assessment & Plan Note (Signed)
Patricia Pugh was referred to me by Archer Asa, PA-C for systolic murmur and dyspnea.  2D echo back 3 months.  She was recently found to be profoundly anemic probably from heavy menses.  She was transfused 3 units of packed red blood cells.  Her hemoglobin improved as did her symptoms.  She does have a soft outflow tract murmur on exam.  We will get a 2D echo to further evaluate.

## 2020-04-02 NOTE — Progress Notes (Signed)
04/02/2020 [name]   [birth year]  [id number]  Primary Physician Lois Huxley, PA Primary Cardiologist: Lorretta Harp MD Lupe Carney, [place]  HPI:  Patricia Pugh is a 49 y.o. mildly overweight married Caucasian female mother of 2 children, grandmother 7 grandchildren referred to me by Patricia Drivers, PA-C for evaluation of a murmur and dyspnea.  She works as an Glass blower/designer.  She does not smoke and has basically no other cardiac risk factors.  There is no family history for heart disease.  She never had a heart attack or stroke.  She denies chest pain.  She was becoming profoundly dyspneic and weak and was found to be profoundly anemic probably from heavy menses.  She was transfused 3 units of packed red blood cells.  She was also noted to have a soft outflow tract murmur.  Since transfusion she feels clinically improved.  She is scheduled for hysterectomy in the near future.   Current Meds  Medication Sig  . aspirin-acetaminophen-caffeine (EXCEDRIN MIGRAINE) [phone] MG per tablet Take 1 tablet by mouth every 6 (six) hours as needed.  . chlorpheniramine (CHLOR-TRIMETON) 4 MG tablet Take 4 mg by mouth 2 (two) times daily as needed for allergies.  . ferrous sulfate 325 (65 FE) MG tablet Take 1 tablet (325 mg total) by mouth 2 (two) times daily with a meal.     No Known Allergies  Social History   Socioeconomic History  . Marital status: Married    Spouse name: Not on file  . Number of children: Not on file  . Years of education: Not on file  . Highest education level: Not on file  Occupational History  . Not on file  Tobacco Use  . Smoking status: Never Smoker  . Smokeless tobacco: Never Used  Substance and Sexual Activity  . Alcohol use: No  . Drug use: Never  . Sexual activity: Not on file  Other Topics Concern  . Not on file  Social History Narrative  . Not on file   Social Determinants of Health   Financial Resource Strain:   . Difficulty of Paying  Living Expenses:   Food Insecurity:   . Worried About Charity fundraiser in the Last Year:   . Arboriculturist in the Last Year:   Transportation Needs:   . Film/video editor (Medical):   Marland Kitchen Lack of Transportation (Non-Medical):   Physical Activity:   . Days of Exercise per Week:   . Minutes of Exercise per Session:   Stress:   . Feeling of Stress :   Social Connections:   . Frequency of Communication with Friends and Family:   . Frequency of Social Gatherings with Friends and Family:   . Attends Religious Services:   . Active Member of Clubs or Organizations:   . Attends Archivist Meetings:   Marland Kitchen Marital Status:   Intimate Partner Violence:   . Fear of Current or Ex-Partner:   . Emotionally Abused:   Marland Kitchen Physically Abused:   . Sexually Abused:      Review of Systems: General: negative for chills, fever, night sweats or weight changes.  Cardiovascular: negative for chest pain, dyspnea on exertion, edema, orthopnea, palpitations, paroxysmal nocturnal dyspnea or shortness of breath Dermatological: negative for rash Respiratory: negative for cough or wheezing Urologic: negative for hematuria Abdominal: negative for nausea, vomiting, diarrhea, bright red blood per rectum, melena, or hematemesis Neurologic: negative for visual changes, syncope,  or dizziness All other systems reviewed and are otherwise negative except as noted above.    Blood pressure 134/73, pulse 73, height 5\' 7"  (1.702 m), weight 211 lb 6.4 oz (95.9 kg), last menstrual period 03/07/2020, SpO2 100 %.  General appearance: alert and no distress Neck: no adenopathy, no carotid bruit, no JVD, supple, symmetrical, trachea midline and thyroid not enlarged, symmetric, no tenderness/mass/nodules Lungs: clear to auscultation bilaterally Heart: 2/6 outflow tract murmur consistent with aortic stenosis and or sclerosis. Extremities: extremities normal, atraumatic, no cyanosis or edema Pulses: 2+ and  symmetric Skin: Skin color, texture, turgor normal. No rashes or lesions Neurologic: Alert and oriented X 3, normal strength and tone. Normal symmetric reflexes. Normal coordination and gait  EKG not performed today  ASSESSMENT AND PLAN:   Systolic murmur Patricia Pugh was referred to me by Archer Asa, PA-C for systolic murmur and dyspnea.  2D echo back 3 months.  She was recently found to be profoundly anemic probably from heavy menses.  She was transfused 3 units of packed red blood cells.  Her hemoglobin improved as did her symptoms.  She does have a soft outflow tract murmur on exam.  We will get a 2D echo to further evaluate.      Lorretta Harp MD [place], [facility] 04/02/2020 5:26 PM

## 2020-04-02 NOTE — Patient Instructions (Signed)
Medication Instructions:  Your physician recommends that you continue on your current medications as directed. Please refer to the Current Medication list given to you today. *If you need a refill on your cardiac medications before your next appointment, please call your pharmacy*  Lab Work: NONE  Testing/Procedures: Your physician has requested that you have an echocardiogram. Echocardiography is a painless test that uses sound waves to create images of your heart. It provides your doctor with information about the size and shape of your heart and how well your heart's chambers and valves are working. This procedure takes approximately one hour. There are no restrictions for this procedure. [place] [address]   Follow-Up: At [facility], you and your health needs are our priority.  As part of our continuing mission to provide you with exceptional heart care, we have created designated Provider Care Teams.  These Care Teams include your primary Cardiologist (physician) and Advanced Practice Providers (APPs -  Physician Assistants and Nurse Practitioners) who all work together to provide you with the care you need, when you need it.  We recommend signing up for the patient portal called "MyChart".  Sign up information is provided on this After Visit Summary.  MyChart is used to connect with patients for Virtual Visits (Telemedicine).  Patients are able to view lab/test results, encounter notes, upcoming appointments, etc.  Non-urgent messages can be sent to your provider as well.   To learn more about what you can do with MyChart, go to [email or web address].    Your next appointment:   3 month(s)  The format for your next appointment:   In Person  Provider:   You may see DR Gwenlyn Found  or one of the following Advanced Practice Providers on your designated Care Team:    Kerin Ransom, PA-C  [place], [place]  Coletta Memos, [place]   Echocardiogram An  echocardiogram is a procedure that uses painless sound waves (ultrasound) to produce an image of the heart. Images from an echocardiogram can provide important information about:  Signs of coronary artery disease (CAD).  Aneurysm detection. An aneurysm is a weak or damaged part of an artery wall that bulges out from the normal force of blood pumping through the body.  Heart size and shape. Changes in the size or shape of the heart can be associated with certain conditions, including heart failure, aneurysm, and CAD.  Heart muscle function.  Heart valve function.  Signs of a past heart attack.  Fluid buildup around the heart.  Thickening of the heart muscle.  A tumor or infectious growth around the heart valves. Tell a health care provider about:  Any allergies you have.  All medicines you are taking, including vitamins, herbs, eye drops, creams, and over-the-counter medicines.  Any blood disorders you have.  Any surgeries you have had.  Any medical conditions you have.  Whether you are pregnant or may be pregnant. What are the risks? Generally, this is a safe procedure. However, problems may occur, including:  Allergic reaction to dye (contrast) that may be used during the procedure. What happens before the procedure? No specific preparation is needed. You may eat and drink normally. What happens during the procedure?   An IV tube may be inserted into one of your veins.  You may receive contrast through this tube. A contrast is an injection that improves the quality of the pictures from your heart.  A gel will be applied to your chest.  A  wand-like tool (transducer) will be moved over your chest. The gel will help to transmit the sound waves from the transducer.  The sound waves will harmlessly bounce off of your heart to allow the heart images to be captured in real-time motion. The images will be recorded on a computer. The procedure may vary among health care  providers and hospitals. What happens after the procedure?  You may return to your normal, everyday life, including diet, activities, and medicines, unless your health care provider tells you not to do that. Summary  An echocardiogram is a procedure that uses painless sound waves (ultrasound) to produce an image of the heart.  Images from an echocardiogram can provide important information about the size and shape of your heart, heart muscle function, heart valve function, and fluid buildup around your heart.  You do not need to do anything to prepare before this procedure. You may eat and drink normally.  After the echocardiogram is completed, you may return to your normal, everyday life, unless your health care provider tells you not to do that. This information is not intended to replace advice given to you by your health care provider. Make sure you discuss any questions you have with your health care provider. Document Revised: 01/04/2019 Document Reviewed: 10/16/2016 Elsevier Patient Education  [place].

## 2020-04-10 NOTE — Progress Notes (Signed)
Pt. Needs orders for the upcomming surgery.PST appointment on 04/11/21.Thanks.

## 2020-04-10 NOTE — Patient Instructions (Addendum)
YOU ARE SCHEDULED FOR A COVID TEST ON: 04/12/20@  10:20 am . THIS TEST MUST BE DONE BEFORE SURGERY. GO TO  [address], [place], [address] AND REMAIN IN YOUR CAR, THIS IS A DRIVE UP TEST. ONCE YOUR COVID TEST IS DONE PLEASE FOLLOW ALL THE QUARANTINE  INSTRUCTIONS GIVEN IN YOUR HANDOUT.      Your procedure is scheduled on: 04/16/20   Report to [facility] AT: 6:30  A. M.   Call this number if you have problems the morning of surgery  :[phone].   OUR ADDRESS IS [place].  WE ARE LOCATED IN [facility].  PLEASE BRING YOUR INSURANCE CARD AND PHOTO ID DAY OF SURGERY.  ONLY ONE PERSON ALLOWED IN FACILITY WAITING AREA.                                     REMEMBER:   DO NOT EAT FOOD OR DRINK LIQUIDS AFTER MIDNIGHT .  YOU MAY  BRUSH YOUR TEETH MORNING OF SURGERY AND RINSE YOUR MOUTH OUT, NO CHEWING GUM CANDY OR MINTS.   IF YOU ARE SPENDING THE NIGHT AFTER SURGERY PLEASE BRING ALL YOUR PRESCRIPTION MEDICATIONS IN THEIR ORIGINAL BOTTLES. 1 VISITOR IS ALLOWED IN WAITING ROOM ONLY DAY OF SURGERY. NO VISITOR MAY SPEND THE NIGHT. VISITOR ARE ALLOWED TO STAY UNTIL 800 PM.                                    DO NOT WEAR JEWERLY, MAKE UP, OR NAIL POLISH ON FINGERNAILS. DO NOT WEAR LOTIONS, POWDERS, PERFUMES OR DEODORANT. DO NOT SHAVE FOR 24 HOURS PRIOR TO DAY OF SURGERY. MEN MAY SHAVE FACE AND NECK. CONTACTS, GLASSES, OR DENTURES MAY NOT BE WORN TO SURGERY.                                    [facility] IS NOT RESPONSIBLE  FOR ANY BELONGINGS.                                      YOU MAY BRING A SMALL OVERNIGHT BAG                               .                                                                                                    [facility] - Preparing for Surgery Before surgery, you can play an important role.  Because skin is not sterile, your skin needs to be as free of germs as possible.  You can reduce the number of germs  on your skin by washing with CHG (chlorahexidine gluconate)  soap before surgery.  CHG is an antiseptic cleaner which kills germs and bonds with the skin to continue killing germs even after washing. Please DO NOT use if you have an allergy to CHG or antibacterial soaps.  If your skin becomes reddened/irritated stop using the CHG and inform your nurse when you arrive at Short Stay. Do not shave (including legs and underarms) for at least 48 hours prior to the first CHG shower.  You may shave your face/neck. Please follow these instructions carefully:  1.  Shower with CHG Soap the night before surgery and the  morning of Surgery.  2.  If you choose to wash your hair, wash your hair first as usual with your  normal  shampoo.  3.  After you shampoo, rinse your hair and body thoroughly to remove the  shampoo.                           4.  Use CHG as you would any other liquid soap.  You can apply chg directly  to the skin and wash                       Gently with a scrungie or clean washcloth.  5.  Apply the CHG Soap to your body ONLY FROM THE NECK DOWN.   Do not use on face/ open                           Wound or open sores. Avoid contact with eyes, ears mouth and genitals (private parts).                       Wash face,  Genitals (private parts) with your normal soap.             6.  Wash thoroughly, paying special attention to the area where your surgery  will be performed.  7.  Thoroughly rinse your body with warm water from the neck down.  8.  DO NOT shower/wash with your normal soap after using and rinsing off  the CHG Soap.                9.  Pat yourself dry with a clean towel.            10.  Wear clean pajamas.            11.  Place clean sheets on your bed the night of your first shower and do not  sleep with pets. Day of Surgery : Do not apply any lotions/deodorants the morning of surgery.  Please wear clean clothes to the hospital/surgery center.  FAILURE TO FOLLOW THESE INSTRUCTIONS  MAY RESULT IN THE CANCELLATION OF YOUR SURGERY PATIENT SIGNATURE_________________________________  NURSE SIGNATURE__________________________________  ________________________________________________________________________

## 2020-04-11 ENCOUNTER — Encounter (HOSPITAL_COMMUNITY)
Admission: RE | Admit: 2020-04-11 | Discharge: 2020-04-11 | Disposition: A | Discharge status: Home / Self Care | Payer: Commercial Managed Care - PPO | Source: Ambulatory Visit | Attending: Obstetrics and Gynecology | Admitting: Obstetrics and Gynecology

## 2020-04-11 ENCOUNTER — Encounter (HOSPITAL_COMMUNITY): Payer: Self-pay

## 2020-04-11 ENCOUNTER — Observation Stay (HOSPITAL_COMMUNITY)
Admission: AD | Admit: 2020-04-11 | Discharge: 2020-04-12 | Disposition: A | Discharge status: Home / Self Care | Payer: Commercial Managed Care - PPO | Source: Ambulatory Visit | Attending: Obstetrics and Gynecology | Admitting: Obstetrics and Gynecology

## 2020-04-11 ENCOUNTER — Other Ambulatory Visit: Payer: Self-pay | Admitting: Obstetrics and Gynecology

## 2020-04-11 ENCOUNTER — Other Ambulatory Visit: Payer: Self-pay

## 2020-04-11 DIAGNOSIS — Z01812 Encounter for preprocedural laboratory examination: Secondary | ICD-10-CM | POA: Diagnosis not present

## 2020-04-11 DIAGNOSIS — Z20822 Contact with and (suspected) exposure to covid-19: Secondary | ICD-10-CM | POA: Diagnosis not present

## 2020-04-11 DIAGNOSIS — N939 Abnormal uterine and vaginal bleeding, unspecified: Secondary | ICD-10-CM | POA: Diagnosis not present

## 2020-04-11 DIAGNOSIS — Z79899 Other long term (current) drug therapy: Secondary | ICD-10-CM | POA: Diagnosis not present

## 2020-04-11 DIAGNOSIS — Z7982 Long term (current) use of aspirin: Secondary | ICD-10-CM | POA: Diagnosis not present

## 2020-04-11 DIAGNOSIS — D649 Anemia, unspecified: Secondary | ICD-10-CM | POA: Diagnosis present

## 2020-04-11 DIAGNOSIS — D259 Leiomyoma of uterus, unspecified: Secondary | ICD-10-CM | POA: Diagnosis not present

## 2020-04-11 HISTORY — DX: Anemia, unspecified: D64.9

## 2020-04-11 HISTORY — DX: Cardiac murmur, unspecified: R01.1

## 2020-04-11 LAB — CBC
HCT: 24.8 % — ABNORMAL LOW (ref 36.0–46.0)
Hemoglobin: 6.7 g/dL — CL (ref 12.0–15.0)
MCH: 20.8 pg — ABNORMAL LOW (ref 26.0–34.0)
MCHC: 27 g/dL — ABNORMAL LOW (ref 30.0–36.0)
MCV: 77 fL — ABNORMAL LOW (ref 80.0–100.0)
Platelets: 437 10*3/uL — ABNORMAL HIGH (ref 150–400)
RBC: 3.22 MIL/uL — ABNORMAL LOW (ref 3.87–5.11)
RDW: 25.1 % — ABNORMAL HIGH (ref 11.5–15.5)
WBC: 6.7 10*3/uL (ref 4.0–10.5)
nRBC: 0 % (ref 0.0–0.2)

## 2020-04-11 LAB — SARS CORONAVIRUS 2 BY RT PCR (HOSPITAL ORDER, PERFORMED IN ~~LOC~~ HOSPITAL LAB): SARS Coronavirus 2: NEGATIVE

## 2020-04-11 MED ORDER — ACETAMINOPHEN 325 MG PO TABS
650.0000 mg | ORAL_TABLET | Freq: Once | ORAL | Status: AC
  Administered 2020-04-12: 650 mg via ORAL
  Filled 2020-04-11: qty 2

## 2020-04-11 MED ORDER — ONDANSETRON HCL 4 MG PO TABS: 4.0000 mg | ORAL_TABLET | Freq: Four times a day (QID) | ORAL | Status: DC | PRN

## 2020-04-11 MED ORDER — SODIUM CHLORIDE 0.9 % IV SOLN: 250.0000 mL | INTRAVENOUS | Status: DC | PRN

## 2020-04-11 MED ORDER — SODIUM CHLORIDE 0.9% IV SOLUTION: Freq: Once | INTRAVENOUS | Status: AC

## 2020-04-11 MED ORDER — SODIUM CHLORIDE 0.9% FLUSH: 3.0000 mL | Freq: Two times a day (BID) | INTRAVENOUS | Status: DC

## 2020-04-11 MED ORDER — ONDANSETRON HCL 4 MG/2ML IJ SOLN: 4.0000 mg | Freq: Four times a day (QID) | INTRAMUSCULAR | Status: DC | PRN

## 2020-04-11 MED ORDER — IBUPROFEN 600 MG PO TABS: 600.0000 mg | ORAL_TABLET | Freq: Four times a day (QID) | ORAL | Status: DC | PRN

## 2020-04-11 MED ORDER — SODIUM CHLORIDE 0.9% FLUSH: 3.0000 mL | INTRAVENOUS | Status: DC | PRN

## 2020-04-11 MED ORDER — DIPHENHYDRAMINE HCL 25 MG PO CAPS
25.0000 mg | ORAL_CAPSULE | Freq: Once | ORAL | Status: AC
  Administered 2020-04-12: 25 mg via ORAL
  Filled 2020-04-11: qty 1

## 2020-04-11 MED ORDER — FUROSEMIDE 10 MG/ML IJ SOLN
20.0000 mg | Freq: Once | INTRAMUSCULAR | Status: AC
  Administered 2020-04-12: 20 mg via INTRAVENOUS
  Filled 2020-04-11: qty 2

## 2020-04-11 MED ORDER — TEMAZEPAM 15 MG PO CAPS: 15.0000 mg | ORAL_CAPSULE | Freq: Every evening | ORAL | Status: DC | PRN

## 2020-04-11 NOTE — Progress Notes (Signed)
RN call the patient to notified her about the lab work results.Also to let her know that her MD. Had been notified,and to watch for any symptoms related to her low hemoglobin levels.Pt. verbalized her understanding of the situation.

## 2020-04-11 NOTE — H&P (Signed)
Patient:Patricia Pugh, Patricia Pugh, A    DOB: Jul 17, 1971    Age: 49 Y    Sex: Female  Phone: [phone]     Primary Insurance: U[employer]     Payer ID: [id number] Address: [address], [place], [id number] Account Number: [id number] PCP: Marilynne Drivers Encounter Date: 04/08/2020    Provider: Christophe Louis, MD Appointment Facility: [facility]  --------------------------------------------------------------------------------  Subjective:    Chief Complaint(s):      Pre op/ Menorrhagia / Abnormal Barnie Mort bleeding /Uterine fibroids/ Anemia       HPI:          Isolation Precautions          Has patient received COVID-19 vaccination?  No.  Does patient report new onset of COVID symptoms?  No.  Has patient or close contact tested positive for COVID-19?  No , not in the past 2 weeks.         General          49 yo presents for  Blood transfusion due to significant anemia and scheduled surgery on 04/16/2020. Pt had preop appt at [facility] long today and her hemoglobin was 6.7. she has small amount of bleeding for the last 2 days however it was much heavier until Wednesday July 13. Marland Kitchen            Pt is scheduled for robotic assisted laparoscopic hysterectomy w/ bilateral salpingectomy on April 16, 2020 secondary to uterine fibroid and menorrhagia.            H/o fibroids diagnosed in 2018. U/S performed in ER on June 17th revealed uterus measuring 11.5 x 7.8 x 7.2 cm. There was a mass noted at top of uterus measuring 6.4 cm, most likely a fibroid. Endometrium not visualized due to fibroid. Bilat OV WNL.            Pt was seen March 12, 2020 by PA Marilynne Drivers c/o lightheadedness and pre-syncope, SOB, and tachycardia when walking. H/o anemia and menorrhagia last evaluated in 2018. HGB was 5.2, critically low. She went to ER on March 13, 2020 for symptomatic anemia and heavy vaginal bleeding. She had a blood transfusion and was started on ferrous sulfate, medroxyprogesterone.            She was last seen March 20, 2020 c/o persistent  bleeding and lightheadedness. Pelvic exam revealed 12 wk size uterus. CBC w/o diff and Iron panel on March 24, 2020 revealed anemia w/ HGB 9.5. She was referred for iron infusion at [facility]. Pathology of EMB performed March 20, 2020 revealed no hyperplasia or malignancy.             On April 08, 2020  she has been experiencing consistent heavy bleeding since Sat July 10. July 1-3, She has been changing her pad every time she goes to the bathroom. She has used Provera in the past which has been effective.            Her dizziness and nausea has not returned. She takes iron BID and experiencing diarrhea.            Pelvic exam revealed 14 wk size uterus and moderate amt of blood in vaginal vault.            Hemoglobin on 04/08/2020 was 9.4.Marland Kitchen pt had a preop hgb today 07/16 of 6.7.. She still has some spotting however the bleeding has decreased significantly since starting provera on 04/08/2020.     Current Medication:  Taking   Chlor-Trimeton Allergy(Chlorpheniramine Maleate CR) 12 MG Tablet Extended Release 1 tablet as needed Orally every 12 hrs, Notes: listed in epic.      Excedrin Migraine(Aspirin-Acetaminophen-Caffeine) 250-250-65 MG Tablet 2 tablets Orally Once a day, Notes: listed in epic.      Ferrous Sulfate 325 (65 Fe) MG Tablet 1 tablet Orally twice a day with meals.      medroxyPROGESTERone Acetate 10 MG Tablet 1 tablet with food Orally Once a day for 10 days.      MiraLax(Polyethylene Glycol 3350) 17 GM/SCOOP Powder as directed Orally Once a day, Notes: prn.      Medication List reviewed and reconciled with the patient.      Medical History:   h/o migraines (better after stopping caffeine)      history of DVT       Allergies/Intolerance:      N.K.D.A.    Gyn History:   Sexual activity not currently sexually active.   Periods : every month.   LMP 03/07/2020-currently.   Denies Birth control.   Last pap smear date 11/24/16 positive HPV.   Denies Last mammogram date more than 5  years ago.   Abnormal pap smear assessed with colposcopy, treated with LEEP.   H/O STD Herpes.   Menarche 48.        OB History:   Number of pregnancies  4.   Pregnancy # 1  live birth, vaginal delivery.   Pregnancy # 2  live birth, vaginal delivery.   Pregnancy # 3  live birth, vaginal delivery.   Pregnancy # 4:  live birth, vaginal delivery.        Surgical History:   LEEP 10/2001      Shoulder (right) 2007       Hospitalization:   childbirth x 4      symptomatic anemia and heavy vaginal bleeding (ER Visit) 03/13/2020       Family History:   Father: alive, Father was adopted: COPD, heart arrythmia- has Psychologist, forensic, HTN, bladder cancer    Mother: alive, Skin cancer    Paternal [place] Father: deceased    Paternal [place] Mother: deceased    Maternal [place] Father: deceased    Maternal Grand Mother: deceased    Sister 1: alive    1 sister(s) .          No family hx of gyn cancer.     Social History:       General         Tobacco use cigarettes:  Never smoked, Tobacco history last updated  04/08/2020, Vaping  No.           no EXPOSURE TO PASSIVE SMOKE.           no Alcohol.           no Caffeine.           no Recreational drug use.           Exercise: yes, walks,.           Marital Status: single, Divorced.           Children: 4 children.           OCCUPATION: employed, [employer].      ROS:       CONSTITUTIONAL         Chills  No.  Fatigue  YES.  Fever  No.  Night sweats  YES.  Recent travel outside [place]  No.  Sweats  No.  Weight change  No.         OPHTHALMOLOGY         Blurring of vision  no.  Change in vision  no.  Double vision  no.         ENT         Dizziness  no.  Nose bleeds  no.  Sore throat  no.  Teeth pain  no.         ALLERGY         Hives  no.         CARDIOLOGY         Chest pain  no.  High blood pressure  no.  Irregular heart beat  no.  Leg edema  no.  Palpitations  no.         RESPIRATORY         Shortness of breath  no.  Cough  no.  Wheezing   no.         UROLOGY         Pain with urination  no.  Urinary urgency  no.  Urinary frequency  YES.  Urinary incontinence  no.  Difficulty urinating  No.  Blood in urine  No.         GASTROENTEROLOGY         Abdominal pain  no.  Appetite change  no.  Bloating/belching  no.  Blood in stool or on toilet paper  no.  Change in bowel movements  no.  Constipation  no.  Diarrhea  no.  Difficulty swallowing  no.  Nausea  no.         FEMALE REPRODUCTIVE         Vulvar pain  no.  Vulvar rash  no.  Abnormal vaginal bleeding  YES.  Breast pain  no.  Nipple discharge  no.  Pain with intercourse  no.  Pelvic pain  no.  Unusual vaginal discharge  no.  Vaginal itching  no.         MUSCULOSKELETAL         Muscle aches  no.         NEUROLOGY         Headache  no.  Tingling/numbness  no.  Weakness  no.         PSYCHOLOGY         Depression  no.  Anxiety  no.  Nervousness  no.  Sleep disturbances  no.  Suicidal ideation  no .         ENDOCRINOLOGY         Excessive thirst  no.  Excessive urination  no.  Hair loss  no.  Heat or cold intolerance  no.         HEMATOLOGY/LYMPH         Abnormal bleeding  no.  Easy bruising  no.  Swollen glands  no.         DERMATOLOGY         New/changing skin lesion  no.  Rash  no.  Sores  no.            Negative except as stated in HPI.   Objective:    Vitals:        Wt 212.4, Wt change .2 lb, Ht 67.5, BMI 32.77, Temp 96.6, Pulse sitting 87, BP sitting 126/82.     Past Results:    Examination:          General  Examination         CONSTITUTIONAL: alert, oriented, NAD.          SKIN:  moist, warm.          EYES:  Conjunctiva clear.          LUNGS: good I:E efffort noted, CTA bilat.          HEART: RRR.          ABDOMEN: soft, non-tender/non-distended, bowel sounds present.          FEMALE GENITOURINARY: normal external genitalia, labia - unremarkable, vagina - pink moist mucosa, no lesions or abnormal discharge, moderate amt of blood in vaginal vault, cervix - no  discharge or lesions or CMT, adnexa - no masses or tenderness, uterus - nontender, 14 wk size uterus.          PSYCH:  affect normal, good eye contact.      Physical Examination:       Chaperone present          Chaperone present Brothers,Sadie 04/08/2020 11:58:33 AM > , for pelvic exam.           Pt aware of scribe services today.    Assessment:     Assessment:    Uterine fibroid - D25.9 (Primary)      Menorrhagia with irregular cycle - N92.1      Anemia - D64.9      History of DVT (deep vein thrombosis) - Z86.718        Plan:    Treatment:     Anemia. Critical value of 6.7 today 04/11/2020 during her preoperative visit. Pt to be admitted/ observation  for 2 units of pRBC's.. R/B/A of transfusion discussed with the patient. She voiced understanding and agrees.  Plan post transfusion Hgb and Hct.       Uterine fibroid          Notes: Pt is scheduled for robotic assisted laparoscopic hysterectomy w/ bilateral salpingectomy on April 16, 2020 secondary to uterine fibroid and menorrhagia. Pt is advised that in order to be discharged from hospital, she will need to be able to ambulate, urinate, tolerate food by mouth, and take pain medication by mouth. Discussed w/ pt risks of hysterectomy including but not limited to infection/bleeding, damage to bowel, bladder, ureters and surrounding organs, with the need for further surgery. Discussed risk of blood transfusion and risk of HIV or hep B&C (1 out of 2 million and 1 out of 200,000, respectively) with blood transfusion. Pt is aware of risks and desires blood transfusion if needed. Pt advised to avoid NSAIDs (Asprin, Aleve, Advil, Ibuprofen, Motrin, Excedrin) between now and surgery given risk of bleeding during surgery. She may take Tylenol for pain management. She is advised to avoid eating or drinking starting midnight prior to surgery. Discussed post-surgery avoidance of driving for 1 week and lifting weight greater than 10 lbs or  intercourse for 6-8 weeks. Follow up 2 wks after surgery for post-op visit.      Menorrhagia with irregular cycle           Continue provera 10 mg daily           Notes: Pt is scheduled for robotic assisted laparoscopic hysterectomy w/ bilateral salpingectomy on April 16, 2020 secondary to uterine fibroid and menorrhagia. Will refill Rx of medroxyprogesterone. Follow up for 2 wk post-op visit.      Anemia. Critical value of 6.7 today 04/11/2020 during her preoperative visit. Pt to be  admitted/ observation  for 2 units of pRBC's       Dr. Thurnell Lose covering 07/16 and 07/17

## 2020-04-11 NOTE — Progress Notes (Signed)
REceived notification from [facility] in lab of critical hgb of 6.7.  Rayetta Humphrey , RN notified of lab value and informed to call office of DR Landry Mellow immediately.  Notified Jessica Zanetto,PAC of HGB result.

## 2020-04-11 NOTE — H&P (Deleted)
  The note originally documented on this encounter has been moved the the encounter in which it belongs.  

## 2020-04-11 NOTE — Progress Notes (Signed)
Direct admit to 6n7 at this time. Ambulating in room- gait steady.

## 2020-04-11 NOTE — Progress Notes (Signed)
COVID Vaccine Completed: Date COVID Vaccine completed: COVID vaccine manufacturer: [place]   PCP - Marilynne Drivers PA Cardiologist - Dr. Quay Burow. LOV: 04/02/20.  Chest x-ray - 03/18/20 EPIC EKG - 03/13/20 EPIC Stress Test -  ECHO -  Cardiac Cath -   Sleep Study -  CPAP -   Fasting Blood Sugar -  Checks Blood Sugar _____ times a day  Blood Thinner Instructions: Aspirin Instructions: Last Dose:  Anesthesia review: Hx: systolic murmur,ECHO schedule for 05/07/20.  Patient denies shortness of breath, fever, cough and chest pain at PAT appointment   Patient verbalized understanding of instructions that were given to them at the PAT appointment. Patient was also instructed that they will need to review over the PAT instructions again at home before surgery.

## 2020-04-11 NOTE — Progress Notes (Signed)
CRITICAL VALUE ALERT  Critical Value: Hemoglobin: 6.7  Date & Time Notied:  04/11/20 @ 4:00 pm  Provider Notified: Dr. Christophe Louis.Left message with Sadie(RN)  Orders Received/Actions taken: No

## 2020-04-12 ENCOUNTER — Other Ambulatory Visit (HOSPITAL_COMMUNITY)
Admission: RE | Admit: 2020-04-12 | Discharge: 2020-04-12 | Disposition: A | Discharge status: Home / Self Care | Payer: Commercial Managed Care - PPO | Source: Ambulatory Visit | Attending: Obstetrics and Gynecology | Admitting: Obstetrics and Gynecology

## 2020-04-12 DIAGNOSIS — N939 Abnormal uterine and vaginal bleeding, unspecified: Secondary | ICD-10-CM | POA: Diagnosis not present

## 2020-04-12 LAB — HEMOGLOBIN AND HEMATOCRIT, BLOOD
HCT: 29.8 % — ABNORMAL LOW (ref 36.0–46.0)
Hemoglobin: 8.4 g/dL — ABNORMAL LOW (ref 12.0–15.0)

## 2020-04-12 LAB — PREPARE RBC (CROSSMATCH)

## 2020-04-12 NOTE — Discharge Summary (Signed)
Physician Discharge Summary  Patient ID: Patricia Pugh MRN: [id number] DOB/AGE: 48-06-1971 48 y.o.  Admit date: 04/11/2020 Discharge date: 04/12/2020  Admission Diagnoses: Severe anemia, Abnormal Uterine bleeding, fibroids  Discharge Diagnoses:  Active Problems:   Anemia Same  Discharged Condition: good  Hospital Course: Pt received 2 units PRBCs with Lasix 20 mg IV.  Transfusion was uncomplicated.  Pt reported spotting overnight, which was significantly decreased from 4 days ago when she was passing large clots.  She denies SOB or dizziness.  Discussed importance of continuing Provera.  Bleeding precautions given.  Pt encouraged to call back for instructions on how to increase medication if bleeding gets heavier.  Consults: None  Significant Diagnostic Studies: labs:  CBC Latest Ref Rng & Units 04/12/2020 04/11/2020 03/14/2020  WBC 4.0 - 10.5 K/uL - 6.7 7.7  Hemoglobin 12.0 - 15.0 g/dL 8.4(L) 6.7(LL) 8.3(L)  Hematocrit 36 - 46 % 29.8(L) 24.8(L) 31.1(L)  Platelets 150 - 400 K/uL - 437(H) 767(H)    Treatments: 2 units PRBCs.  Discharge Exam: Blood pressure (!) 115/53, pulse 70, temperature 98.3 F (36.8 C), temperature source Oral, resp. rate 20, height 5\' 8"  (1.727 m), weight 95.7 kg, SpO2 100 %. General appearance: alert, cooperative and not pale Pelvic: Pad on x 1.5 hours, with a small streak of blood.  Disposition: Discharge disposition: 01-Home or Self Care       Discharge Instructions    Call MD for:  difficulty breathing, headache or visual disturbances   Complete by: As directed    Call MD for:  extreme fatigue   Complete by: As directed    Call MD for:  hives   Complete by: As directed    Call MD for:  persistant dizziness or light-headedness   Complete by: As directed    Call MD for:  persistant nausea and vomiting   Complete by: As directed    Call MD for:  severe uncontrolled pain   Complete by: As directed    Call MD for:  temperature >100.4   Complete  by: As directed    Diet - low sodium heart healthy   Complete by: As directed    Increase activity slowly   Complete by: As directed      Allergies as of 04/12/2020   No Known Allergies     Medication List    STOP taking these medications   aspirin-acetaminophen-caffeine 250-250-65 MG tablet Commonly known as: EXCEDRIN MIGRAINE     TAKE these medications   ferrous sulfate 325 (65 FE) MG tablet Take 1 tablet (325 mg total) by mouth 2 (two) times daily with a meal.   ketotifen 0.025 % ophthalmic solution Commonly known as: ZADITOR Place 1 drop into both eyes daily as needed (allergies).   medroxyPROGESTERone 10 MG tablet Commonly known as: PROVERA Take 10 mg by mouth daily.       Follow-up Information    Christophe Louis, MD. Go on 04/16/2020.   Specialty: Obstetrics and Gynecology Why: Surgery Contact information: [address] [place] [address] [phone]               Signed: Thurnell Lose 04/12/2020, 10:39 AM

## 2020-04-12 NOTE — Discharge Instructions (Signed)

## 2020-04-12 NOTE — Plan of Care (Signed)
Ready for discharge

## 2020-04-13 LAB — TYPE AND SCREEN
ABO/RH(D): O POS
Antibody Screen: NEGATIVE
Unit division: 0
Unit division: 0

## 2020-04-13 LAB — BPAM RBC
Blood Product Expiration Date: 202108072359
Blood Product Expiration Date: 202108072359
ISSUE DATE / TIME: 202107170026
ISSUE DATE / TIME: 202107170327
Unit Type and Rh: 5100
Unit Type and Rh: 5100

## 2020-04-15 ENCOUNTER — Other Ambulatory Visit: Payer: Self-pay | Admitting: Obstetrics and Gynecology

## 2020-04-15 ENCOUNTER — Encounter: Payer: Self-pay | Admitting: Cardiology

## 2020-04-15 NOTE — Progress Notes (Signed)
Anesthesia Chart Review   Case: [id number] Date/Time: 04/16/20 0815   Procedure: XI ROBOTIC ASSISTED LAPAROSCOPIC HYSTERECTOMY AND SALPINGECTOMY (N/A ) - Tracie RNFA confirmed on 04/03/20 CS   Anesthesia type: General   Pre-op diagnosis: MENORRHAGIA WITH IRREGULAR MENSES, FIBROIDS, ANEMIA   Location: [place] OR ROOM .5 / [facility]   Surgeons: Christophe Louis, MD      DISCUSSION:49 y.o. never smoker with h/o DVT, anemia, menorrhagia, fibroids scheduled for above procedure 04/16/20 with Dr. Christophe Louis.   Pt seen by cardiologist, Dr. Quay Burow, 04/02/2020 for evaluation of murmur and dyspnea.  Per note dyspnea improved after she was found to be profoundly anemic, received 3 units rbc.  She dose have a soft outflow tract murmur per Dr. Gwenlyn Found, Echo ordered.  Spoke with Kerin Ransom, PA with cardiology who states Echo can be done after procedure.    Critical hemoglobin 6.7 at PAT visit 04/11/2020.  Dr. Landry Mellow made aware.  Pt admitted and 2 units pRBC transfused.   Hemoglobin 04/12/20 8.4.  VS: BP (!) 146/73   Pulse 95   Temp 37.1 C (Oral)   Resp 20   Ht 5\' 8"  (1.727 m)   Wt 94.8 kg   LMP 03/07/2020   BMI 31.78 kg/m   PROVIDERS: Lois Huxley, PA is PCP   Quay Burow, MD is Cardiologist  LABS: Critical hemoglobin, Dr. Landry Mellow made aware, pt transfused (all labs ordered are listed, but only abnormal results are displayed)  Labs Reviewed  CBC - Abnormal; Notable for the following components:      Result Value   RBC 3.22 (*)    Hemoglobin 6.7 (*)    HCT 24.8 (*)    MCV 77.0 (*)    MCH 20.8 (*)    MCHC 27.0 (*)    RDW 25.1 (*)    Platelets 437 (*)    All other components within normal limits     IMAGES:   EKG: 03/13/2020 Rate 95 bpm  Sinus rhythm   CV:  Past Medical History:  Diagnosis Date  . Anemia   . DVT (deep venous thrombosis) ([place])    provoked by OCP   . Fibroid   . Heart murmur     Past Surgical History:  Procedure Laterality Date  . LEEP    .  SHOULDER SURGERY     Right    MEDICATIONS: . ferrous sulfate 325 (65 FE) MG tablet  . ketotifen (ZADITOR) 0.025 % ophthalmic solution  . medroxyPROGESTERone (PROVERA) 10 MG tablet   No current facility-administered medications for this encounter.     Maia Plan [facility] Pre-Surgical Testing [phone] 04/15/20  11:14 AM

## 2020-04-15 NOTE — Progress Notes (Signed)
Received call from [place] AD @[facility]  noted pt had negative covid test 04-11-2020, resulted right at midnight. Called and spoke w/ Pt via phone and she stated had test done on [place] unit @[facility]  for blood transfusion . Was told by nurse that she would not need to get another covid test done prior to surgery along as she quarantined until surgery day.  Pt stated that she has been quarantining at home and denies any covid symptoms.  Per [place] [facility] AD, pt may proceed with surgery.

## 2020-04-15 NOTE — H&P (Signed)
Subjective:    Chief Complaint(s):      Pre op/ Menorrhagia / Abnormal [place] bleeding /Uterine fibroids/ Anemia       HPI:          Isolation Precautions          Has patient received COVID-19 vaccination?  No.  Does patient report new onset of COVID symptoms?  No.  Has patient or close contact tested positive for COVID-19?  No , not in the past 2 weeks.         General          49 yo presents for  Blood transfusion due to significant anemia and scheduled surgery on 04/16/2020. Pt had preop appt at [facility] long today and her hemoglobin was 6.7. she has small amount of bleeding for the last 2 days however it was much heavier until Wednesday July 13. Marland Kitchen            Pt is scheduled for robotic assisted laparoscopic hysterectomy w/ bilateral salpingectomy on April 16, 2020 secondary to uterine fibroid and menorrhagia.            H/o fibroids diagnosed in 2018. U/S performed in ER on June 17th revealed uterus measuring 11.5 x 7.8 x 7.2 cm. There was a mass noted at top of uterus measuring 6.4 cm, most likely a fibroid. Endometrium not visualized due to fibroid. Bilat OV WNL.            Pt was seen March 12, 2020 by PA Marilynne Drivers c/o lightheadedness and pre-syncope, SOB, and tachycardia when walking. H/o anemia and menorrhagia last evaluated in 2018. HGB was 5.2, critically low. She went to ER on March 13, 2020 for symptomatic anemia and heavy vaginal bleeding. She had a blood transfusion and was started on ferrous sulfate, medroxyprogesterone.            She was last seen March 20, 2020 c/o persistent bleeding and lightheadedness. Pelvic exam revealed 12 wk size uterus. CBC w/o diff and Iron panel on March 24, 2020 revealed anemia w/ HGB 9.5. She was referred for iron infusion at [facility]. Pathology of EMB performed March 20, 2020 revealed no hyperplasia or malignancy.             On April 08, 2020  she has been experiencing consistent heavy bleeding since Sat July 10. July 1-3, She has been changing her pad every  time she goes to the bathroom. She has used Provera in the past which has been effective.            Her dizziness and nausea has not returned. She takes iron BID and experiencing diarrhea.            Pelvic exam revealed 14 wk size uterus and moderate amt of blood in vaginal vault.            Hemoglobin on 04/08/2020 was 9.4.Marland Kitchen pt had a preop hgb  07/16 of 6.7. . She received 2 units of pRBC on 07/16... post transfuion hgb was 8.4     Current Medication:      Taking   Chlor-Trimeton Allergy(Chlorpheniramine Maleate CR) 12 MG Tablet Extended Release 1 tablet as needed Orally every 12 hrs, Notes: listed in epic.      Excedrin Migraine(Aspirin-Acetaminophen-Caffeine) 250-250-65 MG Tablet 2 tablets Orally Once a day, Notes: listed in epic.      Ferrous Sulfate 325 (65 Fe) MG Tablet 1 tablet Orally twice a day with meals.  medroxyPROGESTERone Acetate 10 MG Tablet 1 tablet with food Orally Once a day for 10 days.      MiraLax(Polyethylene Glycol 3350) 17 GM/SCOOP Powder as directed Orally Once a day, Notes: prn.      Medication List reviewed and reconciled with the patient.      Medical History:   h/o migraines (better after stopping caffeine)      history of DVT       Allergies/Intolerance:      N.K.D.A.    Gyn History:   Sexual activity not currently sexually active.   Periods : every month.   LMP 03/07/2020-currently.   Denies Birth control.   Last pap smear date 11/24/16 positive HPV.   Denies Last mammogram date more than 5 years ago.   Abnormal pap smear assessed with colposcopy, treated with LEEP.   H/O STD Herpes.   Menarche 22.        OB History:   Number of pregnancies  4.   Pregnancy # 1  live birth, vaginal delivery.   Pregnancy # 2  live birth, vaginal delivery.   Pregnancy # 3  live birth, vaginal delivery.   Pregnancy # 4:  live birth, vaginal delivery.        Surgical History:   LEEP 10/2001      Shoulder (right) 2007       Hospitalization:    childbirth x 4      symptomatic anemia and heavy vaginal bleeding (ER Visit) 03/13/2020       Family History:   Father: alive, Father was adopted: COPD, heart arrythmia- has Psychologist, forensic, HTN, bladder cancer    Mother: alive, Skin cancer    Paternal [place] Father: deceased    Paternal [place] Mother: deceased    Maternal [place] Father: deceased    Maternal Grand Mother: deceased    Sister 1: alive    1 sister(s) .          No family hx of gyn cancer.     Social History:       General         Tobacco use cigarettes:  Never smoked, Tobacco history last updated  04/08/2020, Vaping  No.           no EXPOSURE TO PASSIVE SMOKE.           no Alcohol.           no Caffeine.           no Recreational drug use.           Exercise: yes, walks,.           Marital Status: single, Divorced.           Children: 4 children.           OCCUPATION: employed, [employer].      ROS:       CONSTITUTIONAL         Chills  No.  Fatigue  YES.  Fever  No.  Night sweats  YES.  Recent travel outside [place]  No.  Sweats  No.  Weight change  No.         OPHTHALMOLOGY         Blurring of vision  no.  Change in vision  no.  Double vision  no.         ENT         Dizziness  no.  Nose bleeds  no.  Sore throat  no.  Teeth pain  no.         ALLERGY         Hives  no.         CARDIOLOGY         Chest pain  no.  High blood pressure  no.  Irregular heart beat  no.  Leg edema  no.  Palpitations  no.         RESPIRATORY         Shortness of breath  no.  Cough  no.  Wheezing  no.         UROLOGY         Pain with urination  no.  Urinary urgency  no.  Urinary frequency  YES.  Urinary incontinence  no.  Difficulty urinating  No.  Blood in urine  No.         GASTROENTEROLOGY         Abdominal pain  no.  Appetite change  no.  Bloating/belching  no.  Blood in stool or on toilet paper  no.  Change in bowel movements  no.  Constipation  no.  Diarrhea  no.  Difficulty swallowing  no.  Nausea  no.         FEMALE  REPRODUCTIVE         Vulvar pain  no.  Vulvar rash  no.  Abnormal vaginal bleeding  YES.  Breast pain  no.  Nipple discharge  no.  Pain with intercourse  no.  Pelvic pain  no.  Unusual vaginal discharge  no.  Vaginal itching  no.         MUSCULOSKELETAL         Muscle aches  no.         NEUROLOGY         Headache  no.  Tingling/numbness  no.  Weakness  no.         PSYCHOLOGY         Depression  no.  Anxiety  no.  Nervousness  no.  Sleep disturbances  no.  Suicidal ideation  no .         ENDOCRINOLOGY         Excessive thirst  no.  Excessive urination  no.  Hair loss  no.  Heat or cold intolerance  no.         HEMATOLOGY/LYMPH         Abnormal bleeding  no.  Easy bruising  no.  Swollen glands  no.         DERMATOLOGY         New/changing skin lesion  no.  Rash  no.  Sores  no.            Negative except as stated in HPI.   Objective:    Vitals:        Wt 212.4, Wt change .2 lb, Ht 67.5, BMI 32.77, Temp 96.6, Pulse sitting 87, BP sitting 126/82.     Past Results:    Examination:          General Examination         CONSTITUTIONAL: alert, oriented, NAD.          SKIN:  moist, warm.          EYES:  Conjunctiva clear.          LUNGS: good I:E efffort noted, CTA bilat.          HEART: RRR.  ABDOMEN: soft, non-tender/non-distended, bowel sounds present.          FEMALE GENITOURINARY: normal external genitalia, labia - unremarkable, vagina - pink moist mucosa, no lesions or abnormal discharge, moderate amt of blood in vaginal vault, cervix - no discharge or lesions or CMT, adnexa - no masses or tenderness, uterus - nontender, 14 wk size uterus.          PSYCH:  affect normal, good eye contact.      Physical Examination:       Chaperone present          Chaperone present Brothers,Sadie 04/08/2020 11:58:33 AM > , for pelvic exam.           Pt aware of scribe services today.    Assessment:     Assessment:    Uterine fibroid - D25.9 (Primary)       Menorrhagia with irregular cycle - N92.1      Anemia - D64.9      History of DVT (deep vein thrombosis) - Z86.718        Plan:    Treatment:     Anemia. Critical value of 6.7 today 04/11/2020 during her preoperative visit. Pt to be admitted/ observation  for 2 units of pRBC's.. R/B/A of transfusion discussed with the patient. She voiced understanding and agrees.  Plan post transfusion Hgb and Hct.       Uterine fibroid          Notes: Pt is scheduled for robotic assisted laparoscopic hysterectomy w/ bilateral salpingectomy on April 16, 2020 secondary to uterine fibroid and menorrhagia. Pt is advised that in order to be discharged from hospital, she will need to be able to ambulate, urinate, tolerate food by mouth, and take pain medication by mouth. Discussed w/ pt risks of hysterectomy including but not limited to infection/bleeding, damage to bowel, bladder, ureters and surrounding organs, with the need for further surgery. Discussed risk of blood transfusion and risk of HIV or hep B&C (1 out of 2 million and 1 out of 200,000, respectively) with blood transfusion. Pt is aware of risks and desires blood transfusion if needed. Pt advised to avoid NSAIDs (Asprin, Aleve, Advil, Ibuprofen, Motrin, Excedrin) between now and surgery given risk of bleeding during surgery. She may take Tylenol for pain management. She is advised to avoid eating or drinking starting midnight prior to surgery. Discussed post-surgery avoidance of driving for 1 week and lifting weight greater than 10 lbs or intercourse for 6-8 weeks. Follow up 2 wks after surgery for post-op visit.      Menorrhagia with irregular cycle           Continue provera 10 mg daily           Notes: Pt is scheduled for robotic assisted laparoscopic hysterectomy w/ bilateral salpingectomy on April 16, 2020 secondary to uterine fibroid and menorrhagia. Will refill Rx of medroxyprogesterone. Follow up for 2 wk post-op visit.      Anemia.  Critical value of 6.7  04/11/2020 during her preoperative visit. Pt received 2 units pRBC on 04/11/2020 and postoperative hgb was 8.4

## 2020-04-15 NOTE — H&P (Deleted)
  The note originally documented on this encounter has been moved the the encounter in which it belongs.  

## 2020-04-15 NOTE — Progress Notes (Signed)
I was contacted to review this patient's chart prior to hysterectomy tomorrow.  The patient was seen by Dr Gwenlyn Found 04/02/2020 for dyspnea and noted to have a soft systolic murmur.  She was found to be significantly anemic. After review of Dr Kennon Holter office note I don't feel she needs an echo pre op and is an acceptable risk for the proposed procedure from a cardiac standpoint without further pre op work up.  Kerin Ransom PA-C 04/15/2020 11:18 AM

## 2020-04-16 ENCOUNTER — Encounter (HOSPITAL_BASED_OUTPATIENT_CLINIC_OR_DEPARTMENT_OTHER)
Admission: RE | Disposition: A | Discharge status: Home / Self Care | Payer: Self-pay | Source: Home / Self Care | Attending: Obstetrics and Gynecology

## 2020-04-16 ENCOUNTER — Observation Stay (HOSPITAL_BASED_OUTPATIENT_CLINIC_OR_DEPARTMENT_OTHER)
Admission: RE | Admit: 2020-04-16 | Discharge: 2020-04-17 | Disposition: A | Discharge status: Home / Self Care | Payer: Commercial Managed Care - PPO | Attending: Obstetrics and Gynecology | Admitting: Obstetrics and Gynecology

## 2020-04-16 ENCOUNTER — Observation Stay (HOSPITAL_BASED_OUTPATIENT_CLINIC_OR_DEPARTMENT_OTHER): Payer: Commercial Managed Care - PPO | Admitting: Physician Assistant

## 2020-04-16 ENCOUNTER — Observation Stay (HOSPITAL_BASED_OUTPATIENT_CLINIC_OR_DEPARTMENT_OTHER): Payer: Commercial Managed Care - PPO | Admitting: Anesthesiology

## 2020-04-16 ENCOUNTER — Encounter (HOSPITAL_BASED_OUTPATIENT_CLINIC_OR_DEPARTMENT_OTHER): Payer: Self-pay | Admitting: Obstetrics and Gynecology

## 2020-04-16 ENCOUNTER — Other Ambulatory Visit: Payer: Self-pay

## 2020-04-16 DIAGNOSIS — D259 Leiomyoma of uterus, unspecified: Secondary | ICD-10-CM | POA: Insufficient documentation

## 2020-04-16 DIAGNOSIS — D649 Anemia, unspecified: Secondary | ICD-10-CM | POA: Insufficient documentation

## 2020-04-16 DIAGNOSIS — Z90710 Acquired absence of both cervix and uterus: Secondary | ICD-10-CM | POA: Diagnosis present

## 2020-04-16 DIAGNOSIS — Z20822 Contact with and (suspected) exposure to covid-19: Secondary | ICD-10-CM | POA: Insufficient documentation

## 2020-04-16 DIAGNOSIS — N920 Excessive and frequent menstruation with regular cycle: Secondary | ICD-10-CM | POA: Diagnosis present

## 2020-04-16 DIAGNOSIS — Z7982 Long term (current) use of aspirin: Secondary | ICD-10-CM | POA: Insufficient documentation

## 2020-04-16 DIAGNOSIS — N939 Abnormal uterine and vaginal bleeding, unspecified: Secondary | ICD-10-CM | POA: Diagnosis not present

## 2020-04-16 DIAGNOSIS — Z79899 Other long term (current) drug therapy: Secondary | ICD-10-CM | POA: Insufficient documentation

## 2020-04-16 HISTORY — PX: ROBOTIC ASSISTED LAPAROSCOPIC HYSTERECTOMY AND SALPINGECTOMY: SHX6379

## 2020-04-16 LAB — CBC
HCT: 35.7 % — ABNORMAL LOW (ref 36.0–46.0)
Hemoglobin: 10 g/dL — ABNORMAL LOW (ref 12.0–15.0)
MCH: 22.7 pg — ABNORMAL LOW (ref 26.0–34.0)
MCHC: 28 g/dL — ABNORMAL LOW (ref 30.0–36.0)
MCV: 81.1 fL (ref 80.0–100.0)
Platelets: 453 10*3/uL — ABNORMAL HIGH (ref 150–400)
RBC: 4.4 MIL/uL (ref 3.87–5.11)
RDW: 24 % — ABNORMAL HIGH (ref 11.5–15.5)
WBC: 4.9 10*3/uL (ref 4.0–10.5)
nRBC: 0 % (ref 0.0–0.2)

## 2020-04-16 LAB — TYPE AND SCREEN
ABO/RH(D): O POS
Antibody Screen: NEGATIVE

## 2020-04-16 LAB — POCT PREGNANCY, URINE: Preg Test, Ur: NEGATIVE

## 2020-04-16 SURGERY — XI ROBOTIC ASSISTED LAPAROSCOPIC HYSTERECTOMY AND SALPINGECTOMY
Anesthesia: General | Site: Abdomen

## 2020-04-16 MED ORDER — OXYCODONE HCL 5 MG PO TABS: 5.0000 mg | ORAL_TABLET | Freq: Once | ORAL | Status: DC | PRN

## 2020-04-16 MED ORDER — SODIUM CHLORIDE 0.9 % IV SOLN
INTRAVENOUS | Status: DC | PRN
  Administered 2020-04-16: 10 mL
  Administered 2020-04-16: 99 mL

## 2020-04-16 MED ORDER — ACETAMINOPHEN 500 MG PO TABS: 1000.0000 mg | ORAL_TABLET | Freq: Three times a day (TID) | ORAL | 0 refills | Status: AC | PRN

## 2020-04-16 MED ORDER — ALUM & MAG HYDROXIDE-SIMETH 200-200-20 MG/5ML PO SUSP: 30.0000 mL | ORAL | Status: DC | PRN

## 2020-04-16 MED ORDER — ROCURONIUM BROMIDE 100 MG/10ML IV SOLN
INTRAVENOUS | Status: DC | PRN
  Administered 2020-04-16: 60 mg via INTRAVENOUS
  Administered 2020-04-16: 10 mg via INTRAVENOUS
  Administered 2020-04-16 (×2): 20 mg via INTRAVENOUS

## 2020-04-16 MED ORDER — CELECOXIB 200 MG PO CAPS
ORAL_CAPSULE | ORAL | Status: AC
  Filled 2020-04-16: qty 2

## 2020-04-16 MED ORDER — OXYCODONE HCL 5 MG/5ML PO SOLN: 5.0000 mg | Freq: Once | ORAL | Status: DC | PRN

## 2020-04-16 MED ORDER — HYDROMORPHONE HCL 1 MG/ML IJ SOLN: 0.2000 mg | INTRAMUSCULAR | Status: DC | PRN

## 2020-04-16 MED ORDER — LACTATED RINGERS IV SOLN
INTRAVENOUS | Status: DC
  Administered 2020-04-16: 125 mL via INTRAVENOUS

## 2020-04-16 MED ORDER — LACTATED RINGERS IV SOLN
INTRAVENOUS | Status: DC | PRN
Start: 2020-04-16 — End: 2020-04-16

## 2020-04-16 MED ORDER — FENTANYL CITRATE (PF) 250 MCG/5ML IJ SOLN
INTRAMUSCULAR | Status: AC
  Filled 2020-04-16: qty 5

## 2020-04-16 MED ORDER — DEXAMETHASONE SODIUM PHOSPHATE 10 MG/ML IJ SOLN
INTRAMUSCULAR | Status: DC | PRN
  Administered 2020-04-16: 5 mg via INTRAVENOUS

## 2020-04-16 MED ORDER — ACETAMINOPHEN 500 MG PO TABS
1000.0000 mg | ORAL_TABLET | Freq: Four times a day (QID) | ORAL | Status: DC
  Administered 2020-04-16 – 2020-04-17 (×3): 1000 mg via ORAL

## 2020-04-16 MED ORDER — MIDAZOLAM HCL 5 MG/5ML IJ SOLN
INTRAMUSCULAR | Status: DC | PRN
  Administered 2020-04-16: 2 mg via INTRAVENOUS

## 2020-04-16 MED ORDER — ONDANSETRON HCL 4 MG PO TABS: 4.0000 mg | ORAL_TABLET | Freq: Four times a day (QID) | ORAL | Status: DC | PRN

## 2020-04-16 MED ORDER — FENTANYL CITRATE (PF) 100 MCG/2ML IJ SOLN
INTRAMUSCULAR | Status: DC | PRN
  Administered 2020-04-16: 50 ug via INTRAVENOUS
  Administered 2020-04-16: 25 ug via INTRAVENOUS
  Administered 2020-04-16 (×4): 50 ug via INTRAVENOUS
  Administered 2020-04-16: 25 ug via INTRAVENOUS
  Administered 2020-04-16 (×3): 50 ug via INTRAVENOUS

## 2020-04-16 MED ORDER — ACETAMINOPHEN 500 MG PO TABS
ORAL_TABLET | ORAL | Status: AC
  Filled 2020-04-16: qty 2

## 2020-04-16 MED ORDER — TEMAZEPAM 15 MG PO CAPS
15.0000 mg | ORAL_CAPSULE | Freq: Every evening | ORAL | Status: DC | PRN
  Administered 2020-04-16 – 2020-04-17 (×2): 15 mg via ORAL
  Filled 2020-04-16: qty 1

## 2020-04-16 MED ORDER — LIDOCAINE 2% (20 MG/ML) 5 ML SYRINGE
INTRAMUSCULAR | Status: AC
  Filled 2020-04-16: qty 5

## 2020-04-16 MED ORDER — ROCURONIUM BROMIDE 10 MG/ML (PF) SYRINGE
PREFILLED_SYRINGE | INTRAVENOUS | Status: AC
Start: 2020-04-16 — End: ?
  Filled 2020-04-16: qty 10

## 2020-04-16 MED ORDER — POVIDONE-IODINE 10 % EX SWAB
2.0000 "application " | Freq: Once | CUTANEOUS | Status: AC
  Administered 2020-04-16: 2 via TOPICAL

## 2020-04-16 MED ORDER — ROCURONIUM BROMIDE 10 MG/ML (PF) SYRINGE
PREFILLED_SYRINGE | INTRAVENOUS | Status: AC
  Filled 2020-04-16: qty 10

## 2020-04-16 MED ORDER — ONDANSETRON HCL 4 MG/2ML IJ SOLN
INTRAMUSCULAR | Status: AC
  Filled 2020-04-16: qty 2

## 2020-04-16 MED ORDER — PROPOFOL 10 MG/ML IV BOLUS
INTRAVENOUS | Status: DC | PRN
  Administered 2020-04-16: 150 mg via INTRAVENOUS

## 2020-04-16 MED ORDER — KETOROLAC TROMETHAMINE 30 MG/ML IJ SOLN
30.0000 mg | Freq: Once | INTRAMUSCULAR | Status: AC
  Administered 2020-04-16: 30 mg via INTRAVENOUS

## 2020-04-16 MED ORDER — MIDAZOLAM HCL 2 MG/2ML IJ SOLN
INTRAMUSCULAR | Status: AC
  Filled 2020-04-16: qty 2

## 2020-04-16 MED ORDER — FENTANYL CITRATE (PF) 100 MCG/2ML IJ SOLN
INTRAMUSCULAR | Status: AC
  Filled 2020-04-16: qty 2

## 2020-04-16 MED ORDER — SODIUM CHLORIDE 0.9 % IV SOLN
INTRAVENOUS | Status: AC
  Filled 2020-04-16: qty 2

## 2020-04-16 MED ORDER — ONDANSETRON HCL 4 MG/2ML IJ SOLN
INTRAMUSCULAR | Status: DC | PRN
  Administered 2020-04-16: 4 mg via INTRAVENOUS

## 2020-04-16 MED ORDER — SODIUM CHLORIDE 0.9 % IV SOLN
2.0000 g | INTRAVENOUS | Status: AC
  Administered 2020-04-16: 2 g via INTRAVENOUS

## 2020-04-16 MED ORDER — SENNA 8.6 MG PO TABS
1.0000 | ORAL_TABLET | Freq: Two times a day (BID) | ORAL | Status: DC
  Administered 2020-04-16: 8.6 mg via ORAL

## 2020-04-16 MED ORDER — MENTHOL 3 MG MT LOZG: 1.0000 | LOZENGE | OROMUCOSAL | Status: DC | PRN

## 2020-04-16 MED ORDER — OXYCODONE HCL 5 MG PO TABS: 5.0000 mg | ORAL_TABLET | ORAL | 0 refills | Status: AC | PRN

## 2020-04-16 MED ORDER — FENTANYL CITRATE (PF) 100 MCG/2ML IJ SOLN
25.0000 ug | INTRAMUSCULAR | Status: DC | PRN
  Administered 2020-04-16 (×3): 25 ug via INTRAVENOUS

## 2020-04-16 MED ORDER — LIDOCAINE HCL (CARDIAC) PF 100 MG/5ML IV SOSY
PREFILLED_SYRINGE | INTRAVENOUS | Status: DC | PRN
  Administered 2020-04-16: 40 mg via INTRAVENOUS

## 2020-04-16 MED ORDER — IBUPROFEN 800 MG PO TABS: 800.0000 mg | ORAL_TABLET | Freq: Three times a day (TID) | ORAL | 0 refills | Status: AC | PRN

## 2020-04-16 MED ORDER — SODIUM CHLORIDE 0.9 % IR SOLN
Status: DC | PRN
  Administered 2020-04-16: 3000 mL

## 2020-04-16 MED ORDER — DEXAMETHASONE SODIUM PHOSPHATE 10 MG/ML IJ SOLN
INTRAMUSCULAR | Status: AC
  Filled 2020-04-16: qty 1

## 2020-04-16 MED ORDER — IBUPROFEN 800 MG PO TABS
800.0000 mg | ORAL_TABLET | Freq: Three times a day (TID) | ORAL | Status: DC
  Administered 2020-04-16 – 2020-04-17 (×2): 800 mg via ORAL

## 2020-04-16 MED ORDER — KETAMINE HCL 10 MG/ML IJ SOLN
INTRAMUSCULAR | Status: AC
  Filled 2020-04-16: qty 1

## 2020-04-16 MED ORDER — PROPOFOL 10 MG/ML IV BOLUS
INTRAVENOUS | Status: AC
  Filled 2020-04-16: qty 20

## 2020-04-16 MED ORDER — CELECOXIB 200 MG PO CAPS
400.0000 mg | ORAL_CAPSULE | ORAL | Status: AC
  Administered 2020-04-16: 400 mg via ORAL

## 2020-04-16 MED ORDER — KETAMINE HCL 10 MG/ML IJ SOLN
INTRAMUSCULAR | Status: DC | PRN
Start: 2020-04-16 — End: 2020-04-16
  Administered 2020-04-16: 30 mg via INTRAVENOUS

## 2020-04-16 MED ORDER — KETOROLAC TROMETHAMINE 30 MG/ML IJ SOLN
INTRAMUSCULAR | Status: AC
  Filled 2020-04-16: qty 1

## 2020-04-16 MED ORDER — ONDANSETRON HCL 4 MG PO TABS: 4.0000 mg | ORAL_TABLET | Freq: Four times a day (QID) | ORAL | 0 refills | Status: AC | PRN

## 2020-04-16 MED ORDER — SODIUM CHLORIDE (PF) 0.9 % IJ SOLN
INTRAMUSCULAR | Status: AC
  Filled 2020-04-16: qty 10

## 2020-04-16 MED ORDER — GABAPENTIN 300 MG PO CAPS
ORAL_CAPSULE | ORAL | Status: AC
  Filled 2020-04-16: qty 1

## 2020-04-16 MED ORDER — LACTATED RINGERS IV SOLN: INTRAVENOUS | Status: DC

## 2020-04-16 MED ORDER — PANTOPRAZOLE SODIUM 40 MG PO TBEC
DELAYED_RELEASE_TABLET | ORAL | Status: AC
  Filled 2020-04-16: qty 1

## 2020-04-16 MED ORDER — ACETAMINOPHEN 500 MG PO TABS
1000.0000 mg | ORAL_TABLET | ORAL | Status: AC
  Administered 2020-04-16: 1000 mg via ORAL

## 2020-04-16 MED ORDER — IBUPROFEN 800 MG PO TABS
ORAL_TABLET | ORAL | Status: AC
  Filled 2020-04-16: qty 1

## 2020-04-16 MED ORDER — OXYCODONE HCL 5 MG PO TABS: 5.0000 mg | ORAL_TABLET | ORAL | Status: DC | PRN

## 2020-04-16 MED ORDER — PANTOPRAZOLE SODIUM 40 MG PO TBEC
40.0000 mg | DELAYED_RELEASE_TABLET | Freq: Every day | ORAL | Status: DC
  Administered 2020-04-16: 40 mg via ORAL

## 2020-04-16 MED ORDER — SIMETHICONE 80 MG PO CHEW: 80.0000 mg | CHEWABLE_TABLET | Freq: Four times a day (QID) | ORAL | Status: DC | PRN

## 2020-04-16 MED ORDER — SUGAMMADEX SODIUM 200 MG/2ML IV SOLN
INTRAVENOUS | Status: DC | PRN
  Administered 2020-04-16: 200 mg via INTRAVENOUS

## 2020-04-16 MED ORDER — ONDANSETRON HCL 4 MG/2ML IJ SOLN: 4.0000 mg | Freq: Four times a day (QID) | INTRAMUSCULAR | Status: DC | PRN

## 2020-04-16 MED ORDER — ONDANSETRON HCL 4 MG/2ML IJ SOLN
4.0000 mg | Freq: Once | INTRAMUSCULAR | Status: AC | PRN
  Administered 2020-04-16: 4 mg via INTRAVENOUS

## 2020-04-16 MED ORDER — SENNA 8.6 MG PO TABS
ORAL_TABLET | ORAL | Status: AC
  Filled 2020-04-16: qty 1

## 2020-04-16 MED ORDER — GABAPENTIN 300 MG PO CAPS
300.0000 mg | ORAL_CAPSULE | ORAL | Status: AC
  Administered 2020-04-16: 300 mg via ORAL

## 2020-04-16 SURGICAL SUPPLY — 64 items
ADH SKN CLS APL DERMABOND .7 (GAUZE/BANDAGES/DRESSINGS) ×1
APL SRG 38 LTWT LNG FL B (MISCELLANEOUS) ×1
APPLICATOR ARISTA FLEXITIP XL (MISCELLANEOUS) ×2 IMPLANT
CATH FOLEY 3WAY  5CC 16FR (CATHETERS) ×3
CATH FOLEY 3WAY 5CC 16FR (CATHETERS) ×1 IMPLANT
CELLS DAT CNTRL 66122 CELL SVR (MISCELLANEOUS) ×1 IMPLANT
COVER BACK TABLE 60X90IN (DRAPES) ×3 IMPLANT
COVER TIP SHEARS 8 DVNC (MISCELLANEOUS) ×1 IMPLANT
COVER TIP SHEARS 8MM DA VINCI (MISCELLANEOUS) ×3
COVER WAND RF STERILE (DRAPES) ×2 IMPLANT
DECANTER SPIKE VIAL GLASS SM (MISCELLANEOUS) ×10 IMPLANT
DEFOGGER SCOPE WARMER CLEARIFY (MISCELLANEOUS) ×3 IMPLANT
DERMABOND ADVANCED (GAUZE/BANDAGES/DRESSINGS) ×2
DERMABOND ADVANCED .7 DNX12 (GAUZE/BANDAGES/DRESSINGS) ×1 IMPLANT
DILATOR CANAL MILEX (MISCELLANEOUS) ×3 IMPLANT
DRAPE ARM DVNC X/XI (DISPOSABLE) ×4 IMPLANT
DRAPE COLUMN DVNC XI (DISPOSABLE) ×1 IMPLANT
DRAPE DA VINCI XI ARM (DISPOSABLE) ×12
DRAPE DA VINCI XI COLUMN (DISPOSABLE) ×3
DRAPE UTILITY 15X26 TOWEL STRL (DRAPES) ×3 IMPLANT
DURAPREP 26ML APPLICATOR (WOUND CARE) ×3 IMPLANT
ELECT REM PT RETURN 9FT ADLT (ELECTROSURGICAL) ×3
ELECTRODE REM PT RTRN 9FT ADLT (ELECTROSURGICAL) ×1 IMPLANT
GLOVE BIO SURGEON STRL SZ7.5 (GLOVE) ×2 IMPLANT
GLOVE BIOGEL M 7.0 STRL (GLOVE) ×9 IMPLANT
GLOVE BIOGEL PI IND STRL 6.5 (GLOVE) IMPLANT
GLOVE BIOGEL PI IND STRL 7.0 (GLOVE) ×6 IMPLANT
GLOVE BIOGEL PI IND STRL 7.5 (GLOVE) IMPLANT
GLOVE BIOGEL PI INDICATOR 6.5 (GLOVE) ×2
GLOVE BIOGEL PI INDICATOR 7.0 (GLOVE) ×14
GLOVE BIOGEL PI INDICATOR 7.5 (GLOVE) ×4
GLOVE ECLIPSE 6.5 STRL STRAW (GLOVE) ×2 IMPLANT
HEMOSTAT ARISTA ABSORB 3G PWDR (HEMOSTASIS) ×4 IMPLANT
IRRIG SUCT STRYKERFLOW 2 WTIP (MISCELLANEOUS) ×3
IRRIGATION SUCT STRKRFLW 2 WTP (MISCELLANEOUS) ×1 IMPLANT
LEGGING LITHOTOMY PAIR STRL (DRAPES) ×3 IMPLANT
MANIFOLD NEPTUNE II (INSTRUMENTS) ×2 IMPLANT
OBTURATOR OPTICAL STANDARD 8MM (TROCAR) ×3
OBTURATOR OPTICAL STND 8 DVNC (TROCAR) ×1
OBTURATOR OPTICALSTD 8 DVNC (TROCAR) IMPLANT
OCCLUDER COLPOPNEUMO (BALLOONS) ×3 IMPLANT
PACK ROBOT WH (CUSTOM PROCEDURE TRAY) ×3 IMPLANT
PACK ROBOTIC GOWN (GOWN DISPOSABLE) ×3 IMPLANT
PACK TRENDGUARD 450 HYBRID PRO (MISCELLANEOUS) IMPLANT
PAD PREP 24X48 CUFFED NSTRL (MISCELLANEOUS) ×3 IMPLANT
POUCH LAPAROSCOPIC INSTRUMENT (MISCELLANEOUS) ×2 IMPLANT
PROTECTOR NERVE ULNAR (MISCELLANEOUS) ×3 IMPLANT
RETRACTOR WND ALEXIS 18 MED (MISCELLANEOUS) ×1 IMPLANT
RTRCTR WOUND ALEXIS 18CM MED (MISCELLANEOUS) ×3
SEAL CANN UNIV 5-8 DVNC XI (MISCELLANEOUS) ×4 IMPLANT
SEAL XI 5MM-8MM UNIVERSAL (MISCELLANEOUS) ×12
SEALER VESSEL DA VINCI XI (MISCELLANEOUS) ×3
SEALER VESSEL EXT DVNC XI (MISCELLANEOUS) IMPLANT
SET TRI-LUMEN FLTR TB AIRSEAL (TUBING) ×2 IMPLANT
SUT VIC AB 0 CT1 27 (SUTURE) ×6
SUT VIC AB 0 CT1 27XBRD ANBCTR (SUTURE) ×2 IMPLANT
SUT VICRYL RAPIDE 4/0 PS 2 (SUTURE) ×9 IMPLANT
SUT VLOC 180 0 9IN  GS21 (SUTURE) ×6
SUT VLOC 180 0 9IN GS21 (SUTURE) ×1 IMPLANT
TIP RUMI ORANGE 6.7MMX12CM (TIP) ×2 IMPLANT
TOWEL OR 17X26 10 PK STRL BLUE (TOWEL DISPOSABLE) ×3 IMPLANT
TRENDGUARD 450 HYBRID PRO PACK (MISCELLANEOUS) ×3
TROCAR PORT AIRSEAL 8X120 (TROCAR) ×3 IMPLANT
WATER STERILE IRR 1000ML POUR (IV SOLUTION) ×3 IMPLANT

## 2020-04-16 NOTE — Anesthesia Postprocedure Evaluation (Signed)
Anesthesia Post Note  Patient: Patricia Pugh  Procedure(s) Performed: XI ROBOTIC ASSISTED LAPAROSCOPIC HYSTERECTOMY AND SALPINGECTOMY (N/A Abdomen)     Patient location during evaluation: PACU Anesthesia Type: General Level of consciousness: awake and alert Pain management: pain level controlled Vital Signs Assessment: post-procedure vital signs reviewed and stable Respiratory status: spontaneous breathing, nonlabored ventilation and respiratory function stable Cardiovascular status: blood pressure returned to baseline and stable Postop Assessment: no apparent nausea or vomiting Anesthetic complications: no   No complications documented.  Last Vitals:  Vitals:   04/16/20 1235 04/16/20 1300  BP:  132/76  Pulse:  78  Resp:  16  Temp:  (!) 36.4 C  SpO2: 100% 100%    Last Pain:  Vitals:   04/16/20 1235  TempSrc:   PainSc: 6                  Lidia Collum

## 2020-04-16 NOTE — Anesthesia Preprocedure Evaluation (Signed)
Anesthesia Evaluation  Patient identified by MRN, date of birth, ID band Patient awake    Reviewed: Allergy & Precautions, NPO status , Patient's Chart, lab work & pertinent test results  History of Anesthesia Complications Negative for: history of anesthetic complications  Airway Mallampati: II  TM Distance: >3 FB Neck ROM: Full    Dental  (+) Teeth Intact   Pulmonary neg pulmonary ROS,    Pulmonary exam normal        Cardiovascular + DVT  Normal cardiovascular exam     Neuro/Psych negative neurological ROS  negative psych ROS   GI/Hepatic negative GI ROS, Neg liver ROS,   Endo/Other  negative endocrine ROS  Renal/GU negative Renal ROS  negative genitourinary   Musculoskeletal negative musculoskeletal ROS (+)   Abdominal   Peds  Hematology  (+) anemia ,   Anesthesia Other Findings  Copied from PAT note: "Pt seen by cardiologist, Dr. Quay Burow, 04/02/2020 for evaluation of murmur and dyspnea.  Per note dyspnea improved after she was found to be profoundly anemic, received 3 units rbc.  She dose have a soft outflow tract murmur per Dr. Gwenlyn Found, Echo ordered.  Spoke with Kerin Ransom, PA with cardiology who states Echo can be done after procedure.    Critical hemoglobin 6.7 at PAT visit 04/11/2020.  Dr. Landry Mellow made aware.  Pt admitted and 2 units pRBC transfused.   Hemoglobin 04/12/20 8.4."  Hgb up to 10.0 on DOS  Reproductive/Obstetrics Uterine fibroid                            Anesthesia Physical Anesthesia Plan  ASA: III  Anesthesia Plan: General   Post-op Pain Management:    Induction: Intravenous  PONV Risk Score and Plan: 4 or greater and Ondansetron, Dexamethasone, Treatment may vary due to age or medical condition and Midazolam  Airway Management Planned: Oral ETT  Additional Equipment: None  Intra-op Plan:   Post-operative Plan: Extubation in OR  Informed Consent: I  have reviewed the patients History and Physical, chart, labs and discussed the procedure including the risks, benefits and alternatives for the proposed anesthesia with the patient or authorized representative who has indicated his/her understanding and acceptance.     Dental advisory given  Plan Discussed with:   Anesthesia Plan Comments:        Anesthesia Quick Evaluation

## 2020-04-16 NOTE — Anesthesia Procedure Notes (Signed)
Procedure Name: Intubation Date/Time: 04/16/2020 8:46 AM Performed by: Garrel Ridgel, CRNA Pre-anesthesia Checklist: Patient identified, Emergency Drugs available, Suction available and Patient being monitored Patient Re-evaluated:Patient Re-evaluated prior to induction Oxygen Delivery Method: Circle system utilized Preoxygenation: Pre-oxygenation with 100% oxygen Induction Type: IV induction Ventilation: Mask ventilation without difficulty Laryngoscope Size: Mac and 3 Grade View: Grade I Tube type: Oral Number of attempts: 1 Airway Equipment and Method: Stylet and Oral airway Placement Confirmation: ETT inserted through vocal cords under direct vision,  positive ETCO2 and breath sounds checked- equal and bilateral Secured at: 21 cm Tube secured with: Tape Dental Injury: Teeth and Oropharynx as per pre-operative assessment

## 2020-04-16 NOTE — Transfer of Care (Signed)
Immediate Anesthesia Transfer of Care Note  Patient: Patricia Pugh  Procedure(s) Performed: XI ROBOTIC ASSISTED LAPAROSCOPIC HYSTERECTOMY AND SALPINGECTOMY (N/A Abdomen)  Patient Location: PACU  Anesthesia Type:General  Level of Consciousness: awake, alert , oriented and patient cooperative  Airway & Oxygen Therapy: Patient Spontanous Breathing and Patient connected to face mask oxygen  Post-op Assessment: Report given to RN and Post -op Vital signs reviewed and stable  Post vital signs: Reviewed and stable  Last Vitals:  Vitals Value Taken Time  BP 128/74 04/16/20 1126  Temp 36.6 C 04/16/20 1127  Pulse 91 04/16/20 1129  Resp 17 04/16/20 1129  SpO2 98 % 04/16/20 1129  Vitals shown include unvalidated device data.  Last Pain:  Vitals:   04/16/20 0738  TempSrc: Oral  PainSc: 3       Patients Stated Pain Goal: 8 (49/32/41 9914)  Complications: No complications documented.

## 2020-04-16 NOTE — H&P (Signed)
Date of Initial H&P:04/15/20  History reviewed, patient examined, no change in status, stable for surgery.

## 2020-04-16 NOTE — Op Note (Signed)
04/16/2020  11:40 AM  PATIENT:  Patricia Pugh  49 y.o. female  PRE-OPERATIVE DIAGNOSIS:  MENORRHAGIA WITH IRREGULAR MENSES, FIBROIDS, ANEMIA  POST-OPERATIVE DIAGNOSIS:  MENORRHAGIA WITH IRREGULAR MENSES, FIBROIDS, ANEMIA  PROCEDURE:  Procedure(s) with comments: XI ROBOTIC ASSISTED LAPAROSCOPIC HYSTERECTOMY AND SALPINGECTOMY (N/A) - Tracie RNFA confirmed on 04/03/20 CS  SURGEON:  Surgeon(s) and Role:    Christophe Louis, MD - Primary  PHYSICIAN ASSISTANT:   ASSISTANTS: Gaylord Shih RNFA   ANESTHESIA:   general  EBL:  40 mL   BLOOD ADMINISTERED:none  DRAI NS: None  LOCAL MEDICATIONS USED:  OTHER Ropivicaine  SPECIMEN:  Source of Specimen:  Uterus, cervix and bilateral fallopian tubes   DISPOSITION OF SPECIMEN:  PATHOLOGY  COUNTS:  YES  TOURNIQUET:  * No tourniquets in log *  DICTATION: .Dragon Dictation  PLAN OF CARE: Admit for overnight observation  PATIENT DISPOSITION:  PACU - hemodynamically stable.   Delay start of Pharmacological VTE agent (>24hrs) due to surgical blood loss or risk of bleeding: not applicable  Findings: Enlarged fibroid uterus, normal appearing fallpian tubes and ovaries.   Procedure: The patient was taken to the operating room where she was placed under general anesthesia.Time out was performed. Marland Kitchen She was placed in dorsal lithotomy position and prepped and draped in the usual sterile fashion. A weighted speculum was placed into the vagina. A Deaver was placed anteriorly for retraction. The anterior lip of the cervix was grasped with a single-tooth tenaculum. The vaginal mucosa was injected with 2.5 cc of ropivacaine at the 2/4/ 8 and 10 o'clock positions. The uterus was sounded to 13 cm. the cervix was dilated to 6 mm . 0 vicryl suture placed at the 12 and 6:00 positions Of the cervix to facilitate placement of a Ru mi uterine manipulator. The manipulator was placed without difficulty. Weighted speculum and Deaver were removed .  Attention was turned  to the patient's abdomen where a 8 mm trocar was placed 2 cm above the umbilicus. under direct visualization . The pneumoperitoneum was achieved with PCO2 gas. The laparoscope was removed. 60 cc of ropivacaine were injected into the abdominal cavity. The laparoscope was reinserted. An 8 mm trocar was placed in the right upper quadrant 16 centimeters from the umbilicus.later connected to robotic arm #4). An 8MM incision was made in the Right upper quadrant TROCAR WAS PLACED 8 cm from the umbilicus. Later connected to robotic arm #3. An 8 mm incision was made in the left upper quadrant 16 cm from the umbilicus and connected to robot arm #1. Marland Kitchen Attention was turned to the left upper quadrant where a 8 mm midclavicular assistant trocar was placed. ( All incision sites were injected with 10cc of ropivacaine prior to port placement. )  Once all ports had been placed under direct visualization.The laparoscope was removed and the [place] robotic system was thin right-sided docked. The robotic arms were connected to the corresponding trocars as listed above. The laparoscope was then reinserted. The long tip bipolar forceps were placed into port #1. The monopolar scissor placed in the port #4. A vessel sealerwas placed in port #3. All instruments were directed into the pelvis under direct visualization.  Attention was turned to the surgeons console.. The left mesosalpinx and left utero-ovarian ligament was cauterized and transected with the vessel sealer The broad ligament was cauterized and transected with the vessel sealer .The round ligament was cauterized and transected with the vessel sealer  The anterior leaf of broad ligament was  incised along the bladder reflection to the midline.  The right  mesosalpinx and right utero-ovarian ligament was cauterized and transected with the vessel sealer. The right broad ligament was cauterized and transected with the vessel sealer. The right round ligament was cauterized and  transected with the vessel sealer The broad ligament was incised to the midline. The bladder was dissected off the lower uterine segments of the cervix via sharp and blunt dissection.   The uterine arteries were skeletonized  bilaterally. They were  cauterized and transected with the vessel sealer The KOH ring was identified. The anterior colpotomy was performed followed by the posterior colpotomy. Once the uterus,cervix and bilateral fallopian tubes were completely excised was removed through the vagina. The  bipolar forceps and scissors were removed and log tip forceps were placed in the port #1 and the needle driver was placed in to port #3.  The vaginal cuff was closed with running suture if 0 v-lock. The pelvis was irrigated. Marland KitchenMarland KitchenMarland KitchenExcellent hemostasis was noted. Arista was placed along the vaginal cuff.  All pelvic pedicles were examined and hemostasis was noted.  All instruments removed from the ports. All ports were removed under direct Visualization. The pneumoperitoneum was released. The skin incisions were closed with 4-0 Vicryl and then covered with Derma bond.     Sponge lap and needle counts weIre correct x. The patient was awakened from anesthesia and taken to the recovery room in stable condition.

## 2020-04-17 ENCOUNTER — Encounter (HOSPITAL_BASED_OUTPATIENT_CLINIC_OR_DEPARTMENT_OTHER): Payer: Self-pay | Admitting: Obstetrics and Gynecology

## 2020-04-17 DIAGNOSIS — N939 Abnormal uterine and vaginal bleeding, unspecified: Secondary | ICD-10-CM | POA: Diagnosis not present

## 2020-04-17 LAB — HEMOGLOBIN: Hemoglobin: 8.7 g/dL — ABNORMAL LOW (ref 12.0–15.0)

## 2020-04-17 MED ORDER — ACETAMINOPHEN 500 MG PO TABS
ORAL_TABLET | ORAL | Status: AC
  Filled 2020-04-17: qty 2

## 2020-04-17 MED ORDER — IBUPROFEN 800 MG PO TABS
ORAL_TABLET | ORAL | Status: AC
  Filled 2020-04-17: qty 1

## 2020-04-17 NOTE — Discharge Summary (Signed)
Physician Discharge Summary  Patient ID: Patricia Pugh MRN: [id number] DOB/AGE: 48-Nov-1972 48 y.o.  Admit date: 04/16/2020 Discharge date: 04/17/2020  Admission Diagnoses:  Discharge Diagnoses:  Active Problems:   Menorrhagia   S/P hysterectomy   Discharged Condition: stable  Hospital Course: Patient was admitted for observation after undergoing a robotic assisted laparoscopic hysterectomy with bilateral salpingectomy. She did well postoperatively with return of bowel and bladder function. Hgb on pod #1 was 8.7  Consults: None  Significant Diagnostic Studies: labs: pod #1 hgb 8.7  Treatments: surgery: robotic assisted laparoscopic hysterectomy with bilateral salpingectomy.  Discharge Exam: Blood pressure (!) 125/49, pulse 81, temperature 98.1 F (36.7 C), resp. rate 16, height 5\' 8"  (1.727 m), weight 94.1 kg, SpO2 98 %. General appearance: alert, cooperative and no distress GI: soft appropriately tender nondistended no guarding no rebound  Extremities: extremities normal, atraumatic, no cyanosis or edema  Incision well approximated without erythema or exudate  Disposition: Discharge disposition: 01-Home or Self Care       Discharge Instructions    Call MD for:  persistant nausea and vomiting   Complete by: As directed    Call MD for:  redness, tenderness, or signs of infection (pain, swelling, redness, odor or green/yellow discharge around incision site)   Complete by: As directed    Call MD for:  severe uncontrolled pain   Complete by: As directed    Call MD for:  temperature >100.4   Complete by: As directed    Diet general   Complete by: As directed    Driving Restrictions   Complete by: As directed    Avoid driving for 1 week   Increase activity slowly   Complete by: As directed    Lifting restrictions   Complete by: As directed    Avoid lifting over 10 lbs   No wound care   Complete by: As directed    Sexual Activity Restrictions   Complete by: As  directed    Avoid sexual activity for 6-8 weeks and until approved by Dr. Landry Mellow     Allergies as of 04/17/2020   No Known Allergies     Medication List    STOP taking these medications   ferrous sulfate 325 (65 FE) MG tablet   medroxyPROGESTERone 10 MG tablet Commonly known as: PROVERA     TAKE these medications   acetaminophen 500 MG tablet Commonly known as: TYLENOL Take 2 tablets (1,000 mg total) by mouth every 8 (eight) hours as needed. Notes to patient: Next dose due at [facility] as needed for pain   ibuprofen 800 MG tablet Commonly known as: ADVIL Take 1 tablet (800 mg total) by mouth every 8 (eight) hours as needed. Notes to patient: Due at [facility] as needed for pain   ketotifen 0.025 % ophthalmic solution Commonly known as: ZADITOR Place 1 drop into both eyes daily as needed (allergies).   ondansetron 4 MG tablet Commonly known as: ZOFRAN Take 1 tablet (4 mg total) by mouth every 6 (six) hours as needed for nausea.   oxyCODONE 5 MG immediate release tablet Commonly known as: Oxy IR/ROXICODONE Take 1-2 tablets (5-10 mg total) by mouth every 4 (four) hours as needed for up to 7 days for moderate pain.       Follow-up Information    Christophe Louis, MD. Go in 2 week(s).   Specialty: Obstetrics and Gynecology Why: please call to make an appointment for a postoperative visit in 2 weeks if you do not already  have an appointment scheduled Contact information: [address] [place] [address] [phone]               Signed: Christophe Louis 04/17/2020, 7:26 AM

## 2020-04-17 NOTE — Discharge Instructions (Signed)
Laparoscopically Assisted Vaginal Hysterectomy, Care After This sheet gives you information about how to care for yourself after your procedure. Your health care provider may also give you more specific instructions. If you have problems or questions, contact your health care provider. What can I expect after the procedure? After the procedure, it is common to have:  Soreness and numbness in your incision areas.  Abdominal pain. You will be given pain medicine to control it.  Vaginal bleeding and discharge. You will need to use a sanitary napkin after this procedure.  Sore throat from the breathing tube that was inserted during surgery. Follow these instructions at home: Medicines  Take over-the-counter and prescription medicines only as told by your health care provider.  Do not take aspirin or ibuprofen. These medicines can cause bleeding.  Do not drive or use heavy machinery while taking prescription pain medicine.  Do not drive for 24 hours if you were given a medicine to help you relax (sedative) during the procedure. Incision care   Follow instructions from your health care provider about how to take care of your incisions. Make sure you: ? Wash your hands with soap and water before you change your bandage (dressing). If soap and water are not available, use hand sanitizer. ? Change your dressing as told by your health care provider. ? Leave stitches (sutures), skin glue, or adhesive strips in place. These skin closures may need to stay in place for 2 weeks or longer. If adhesive strip edges start to loosen and curl up, you may trim the loose edges. Do not remove adhesive strips completely unless your health care provider tells you to do that.  Check your incision area every day for signs of infection. Check for: ? Redness, swelling, or pain. ? Fluid or blood. ? Warmth. ? Pus or a bad smell. Activity  Get regular exercise as told by your health care provider. You may be  told to take short walks every day and go farther each time.  Return to your normal activities as told by your health care provider. Ask your health care provider what activities are safe for you.  Do not douche, use tampons, or have sexual intercourse for at least 6 weeks, or until your health care provider gives you permission.  Do not lift anything that is heavier than 10 lb (4.5 kg), or the limit that your health care provider tells you, until he or she says that it is safe. General instructions  Do not take baths, swim, or use a hot tub until your health care provider approves. Take showers instead of baths.  Do not drive for 24 hours if you received a sedative.  Do not drive or operate heavy machinery while taking prescription pain medicine.  To prevent or treat constipation while you are taking prescription pain medicine, your health care provider may recommend that you: ? Drink enough fluid to keep your urine clear or pale yellow. ? Take over-the-counter or prescription medicines. ? Eat foods that are high in fiber, such as fresh fruits and vegetables, whole grains, and beans. ? Limit foods that are high in fat and processed sugars, such as fried and sweet foods.  Keep all follow-up visits as told by your health care provider. This is important. Contact a health care provider if:  You have signs of infection, such as: ? Redness, swelling, or pain around your incision sites. ? Fluid or blood coming from an incision. ? An incision that feels warm to the   touch. ? Pus or a bad smell coming from an incision.  Your incision breaks open.  Your pain medicine is not helping.  You feel dizzy or light-headed.  You have pain or bleeding when you urinate.  You have persistent nausea and vomiting.  You have blood, pus, or a bad-smelling discharge from your vagina. Get help right away if:  You have a fever.  You have severe abdominal pain.  You have chest pain.  You have  shortness of breath.  You faint.  You have pain, swelling, or redness in your leg.  You have heavy bleeding from your vagina. Summary  After the procedure, it is common to have abdominal pain and vaginal bleeding.  You should not drive or lift heavy objects until your health care provider says that it is safe.  Contact your health care provider if you have any symptoms of infection, excessive vaginal bleeding, nausea, vomiting, or shortness of breath. This information is not intended to replace advice given to you by your health care provider. Make sure you discuss any questions you have with your health care provider. Document Revised: 08/26/2017 Document Reviewed: 11/09/2016 Elsevier Patient Education  2020 [employer]  

## 2020-04-18 LAB — SURGICAL PATHOLOGY

## 2020-04-22 ENCOUNTER — Encounter (HOSPITAL_BASED_OUTPATIENT_CLINIC_OR_DEPARTMENT_OTHER): Payer: Self-pay | Admitting: Obstetrics and Gynecology

## 2020-05-07 ENCOUNTER — Ambulatory Visit (HOSPITAL_COMMUNITY): Payer: Commercial Managed Care - PPO | Attending: Cardiology

## 2020-05-07 ENCOUNTER — Other Ambulatory Visit: Payer: Self-pay

## 2020-05-07 DIAGNOSIS — R011 Cardiac murmur, unspecified: Secondary | ICD-10-CM | POA: Diagnosis not present

## 2020-05-07 LAB — ECHOCARDIOGRAM COMPLETE
Area-P 1/2: 4.06 cm2
S' Lateral: 3.1 cm

## 2020-07-29 ENCOUNTER — Ambulatory Visit: Payer: Commercial Managed Care - PPO | Admitting: Cardiovascular Disease

## 2020-08-19 ENCOUNTER — Ambulatory Visit: Payer: Commercial Managed Care - PPO | Admitting: Cardiovascular Disease

## 2022-04-28 IMAGING — US US PELVIS COMPLETE
1 series · 13 of 25 positions shown · non-contrast
Comparison: None.

CLINICAL DATA: Vaginal bleeding

EXAM:
TRANSABDOMINAL ULTRASOUND OF PELVIS
TECHNIQUE: Transabdominal ultrasound examination of the pelvis was performed
including evaluation of the uterus, ovaries, adnexal regions, and
pelvic cul-de-sac.

[Series 1: us pelvic complete with transvaginal · 13 of 45 slices shown]
[im 1/45]
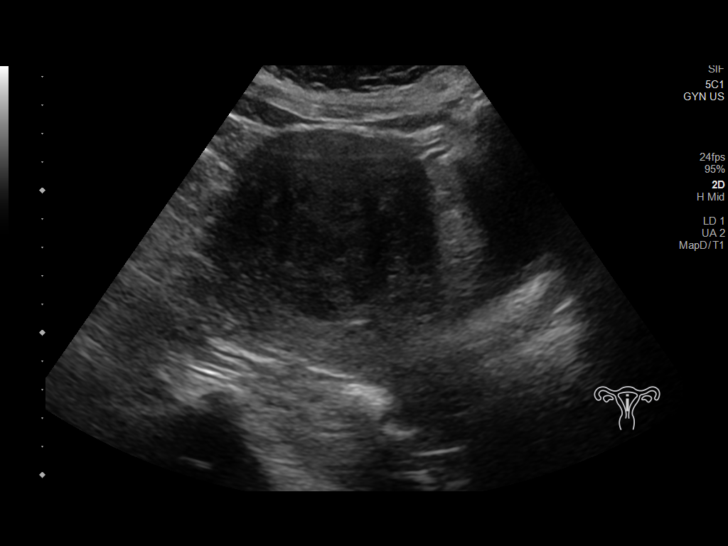
[im 4/45]
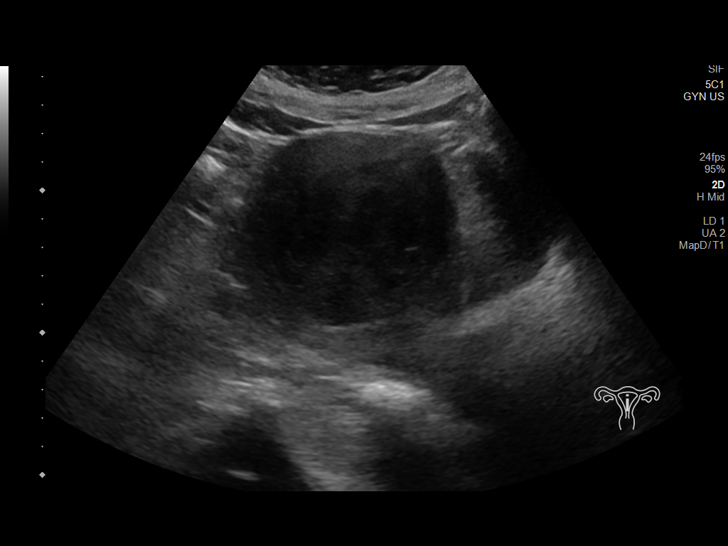
[im 8/45]
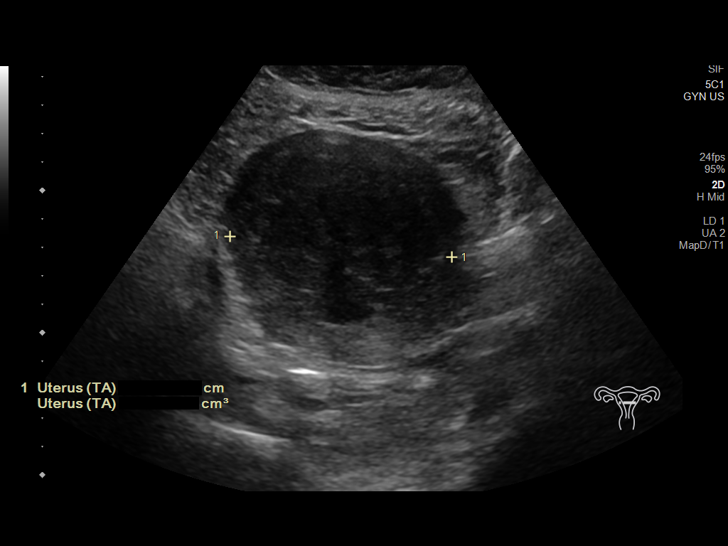
[im 12/45]
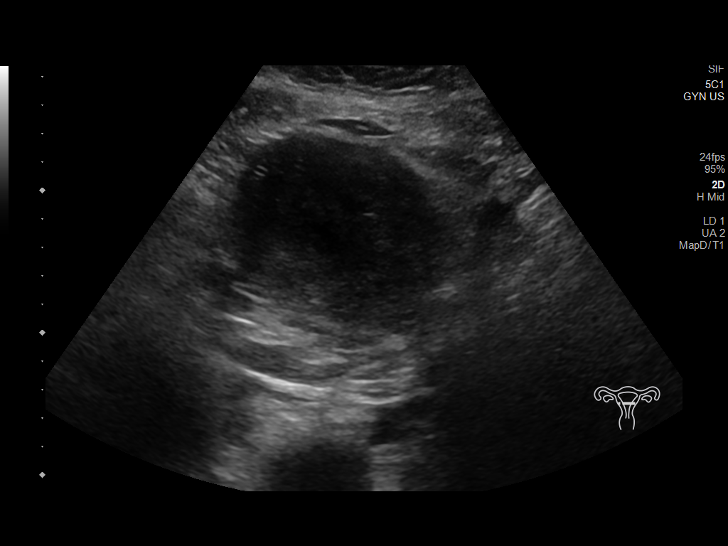
[im 15/45]
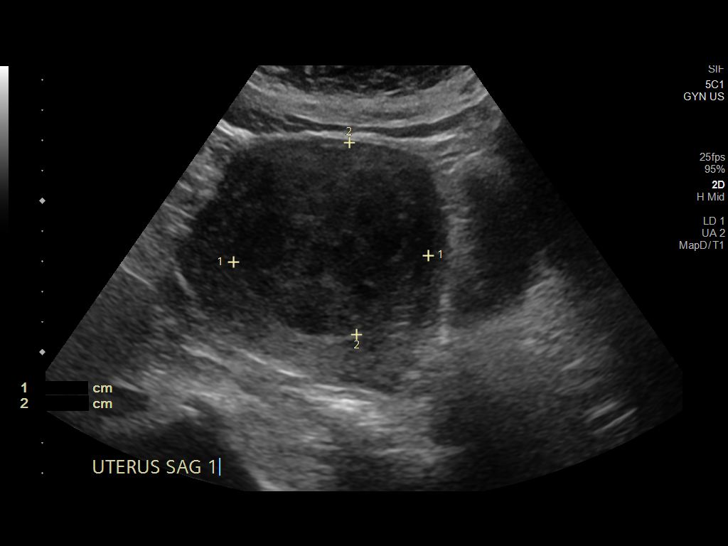
[im 19/45]
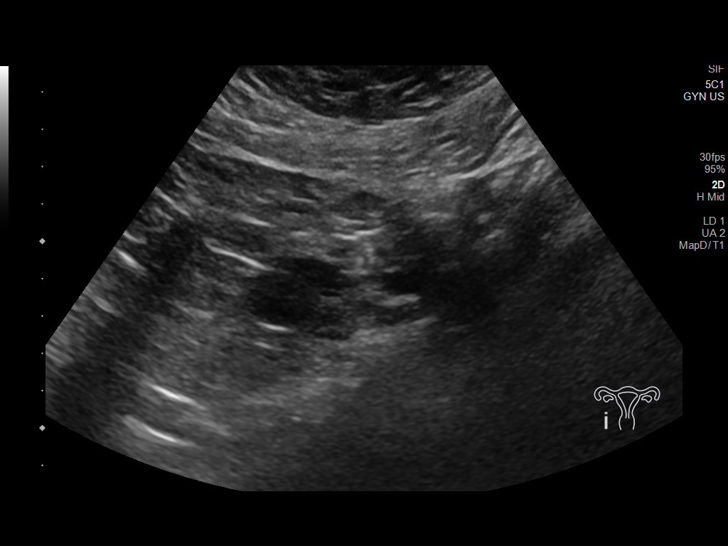
[im 23/45]
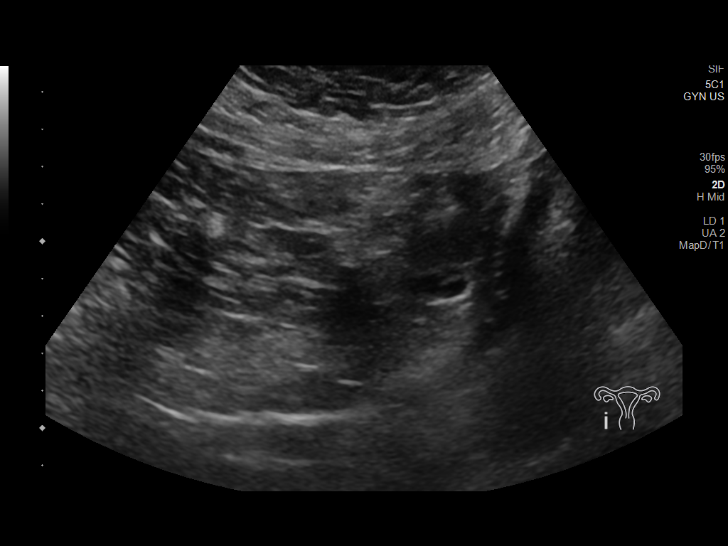
[im 26/45]
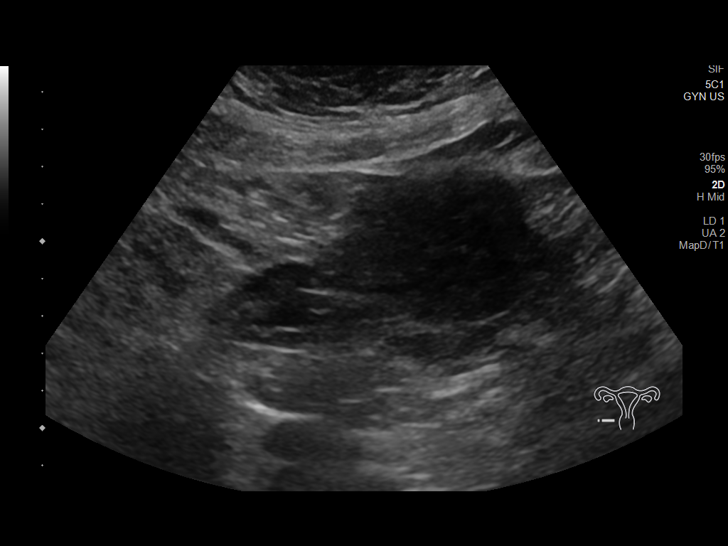
[im 30/45]
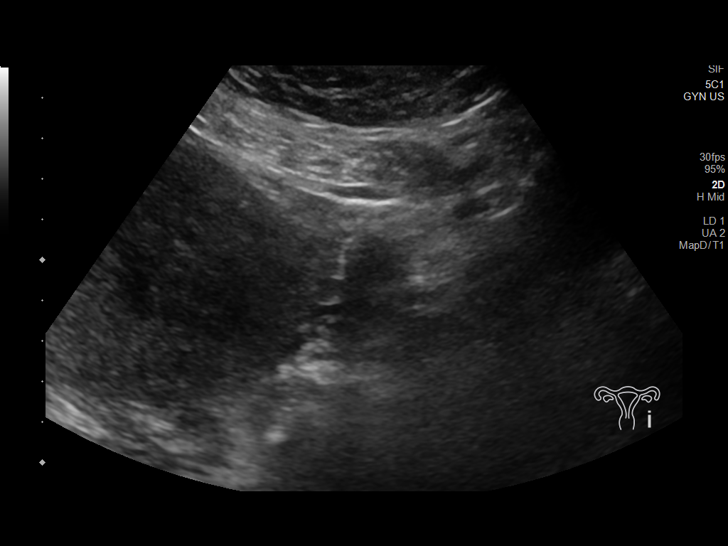
[im 34/45]
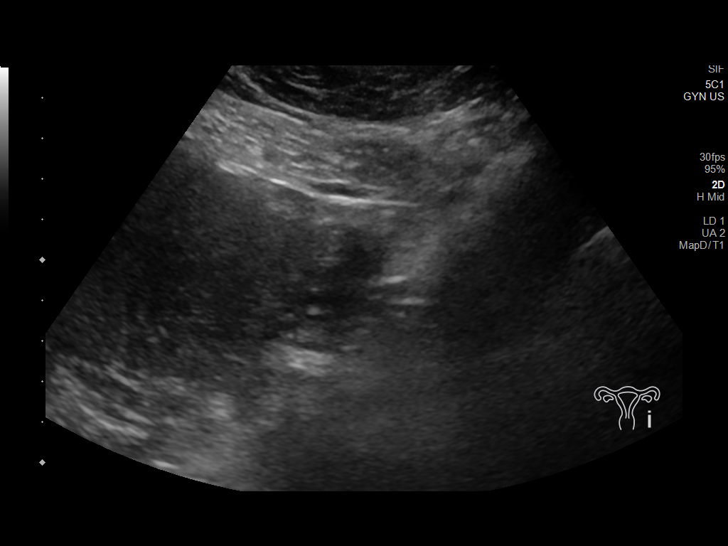
[im 37/45]
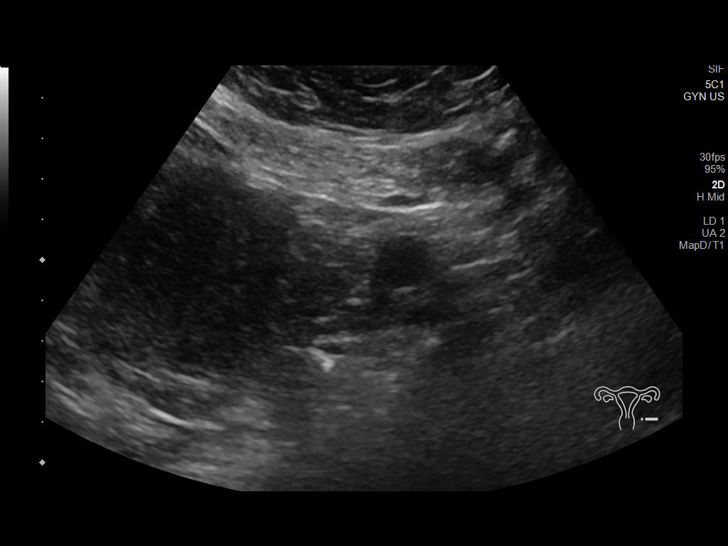
[im 41/45]
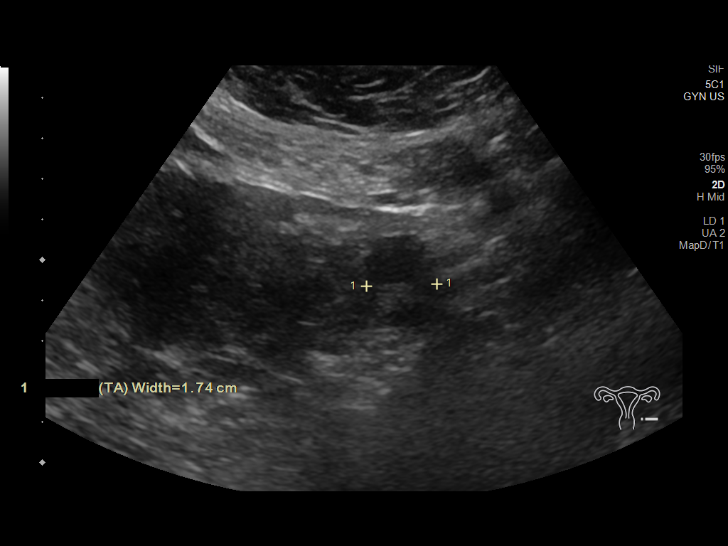
[im 45/45]
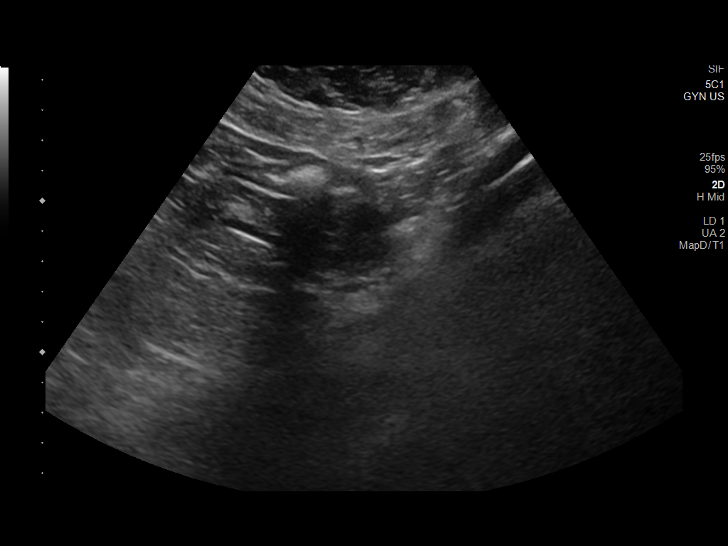

[13 of 25 positions shown; findings below may reference images not displayed]

FINDINGS: Uterus

Measurements: 11.5 x 7.2 x 7.8 = volume: 338.5 mL. There is a
dominant hypoechoic mass arising in the superior fundus region in
the midline measuring 6.4 x 6.4 x 6.2 cm.

Endometrium

Endometrium cannot be seen due to the apparent leiomyoma which
potentially may arise from the endometrium or may distort compress
the endometrium.

Right ovary

Measurements: 3.1 x 1.4 x 2.7 cm = volume: 6.2 mL. Normal
appearance/no adnexal mass.

Left ovary

Measurements: 2.3 x 1.7 x 1.7 cm = volume: 3.5 mL. Normal
appearance/no adnexal mass.

Other findings:  No abnormal free fluid.
IMPRESSION: Hypoechoic mass arising from the midline of the mid upper uterus
measuring 6.4 x 6.4 x 6.2 cm. This presumed leiomyoma may arise from
the endometrium. The endometrium cannot be visualized due to this
mass. It is possible that this mass is causing severe
compression/distortion of the endometrium.

No extrauterine pelvic mass evident.  No free pelvic fluid.

## 2022-05-02 IMAGING — CR DG CHEST 2V
2 series · 2 of 2 positions shown · non-contrast
Comparison: Radiograph 11/30/2016

CLINICAL DATA: Six days of shortness of breath, upper chest and
back pain, posterior pain worse with deep inspiration.

EXAM:
CHEST - 2 VIEW

[w chest pa]
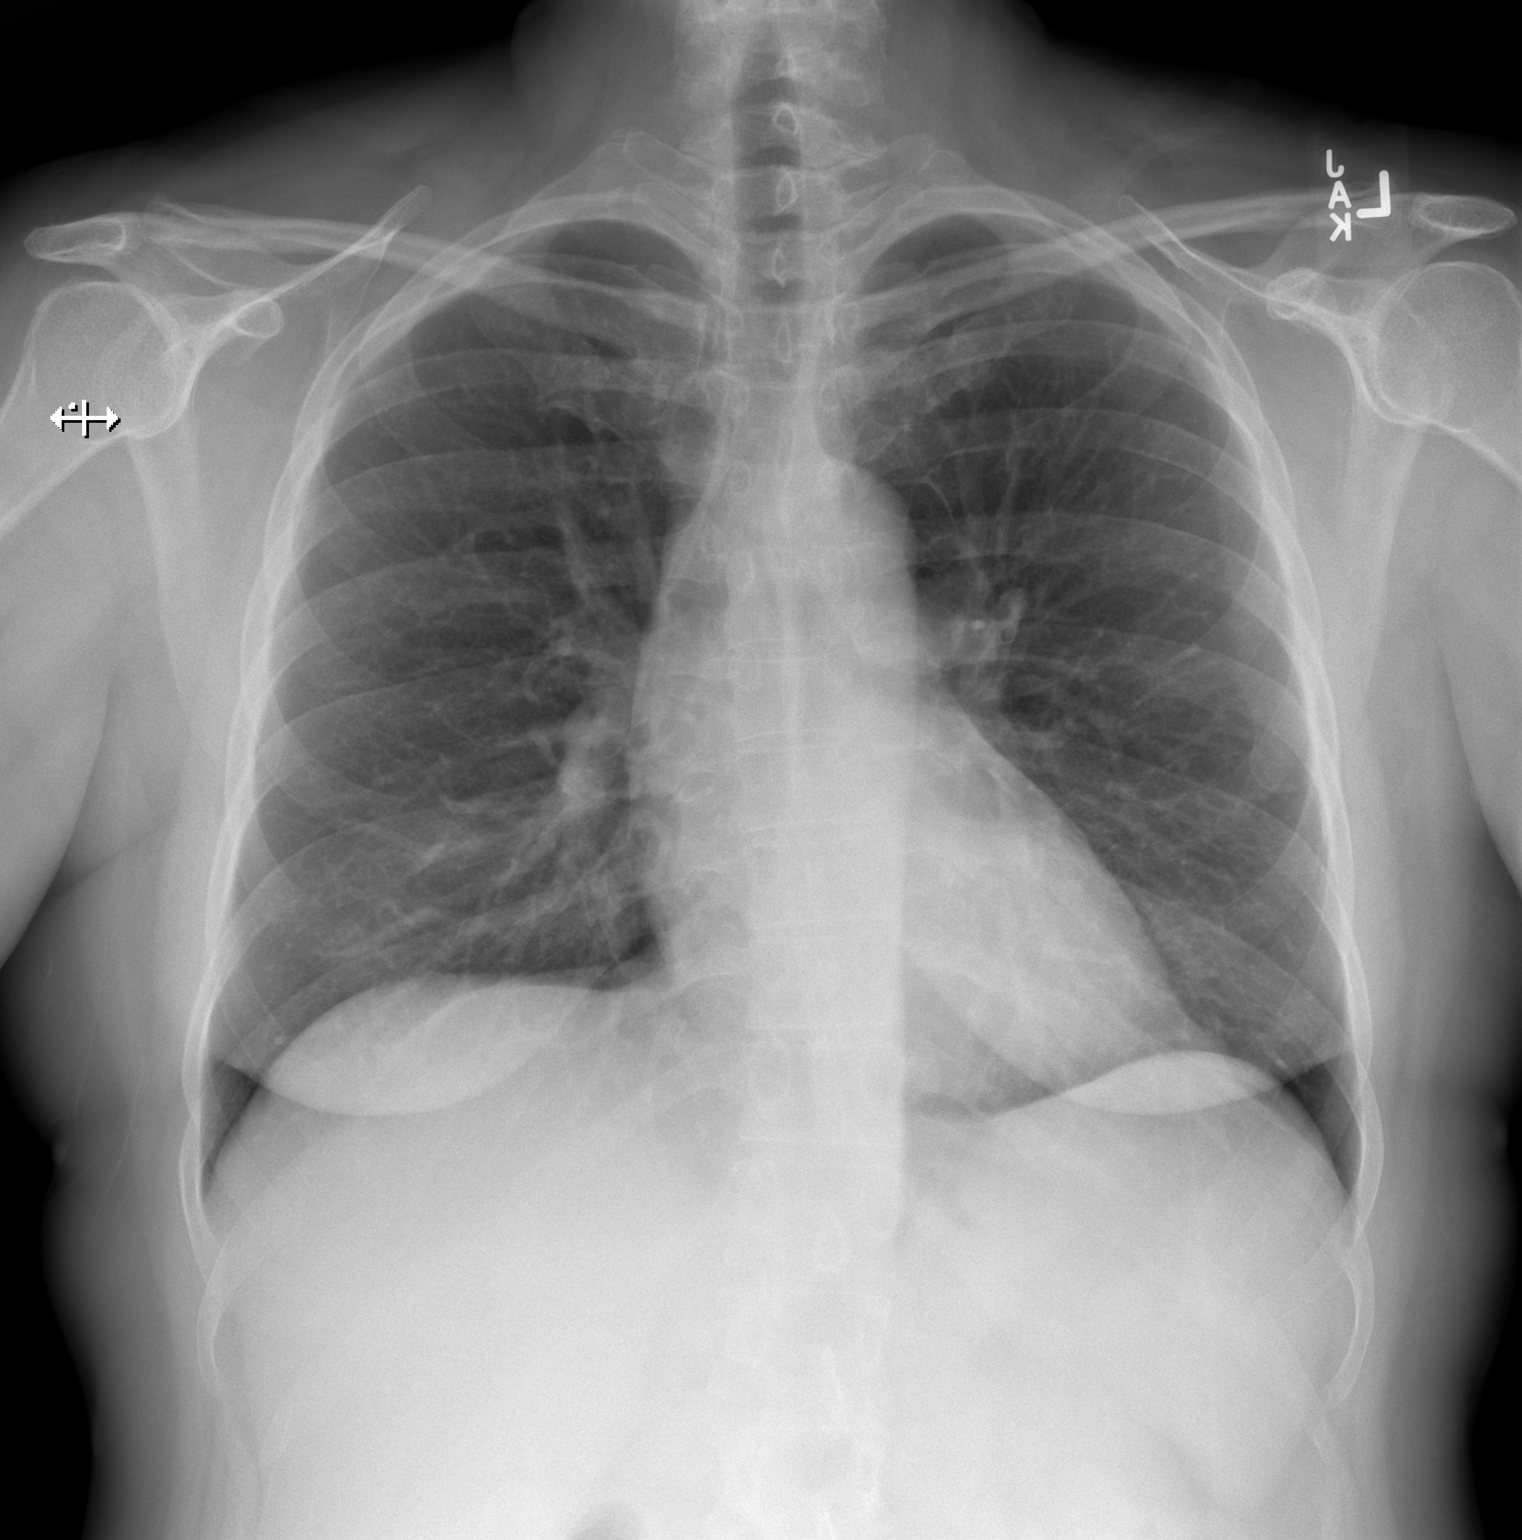

[w chest lat]
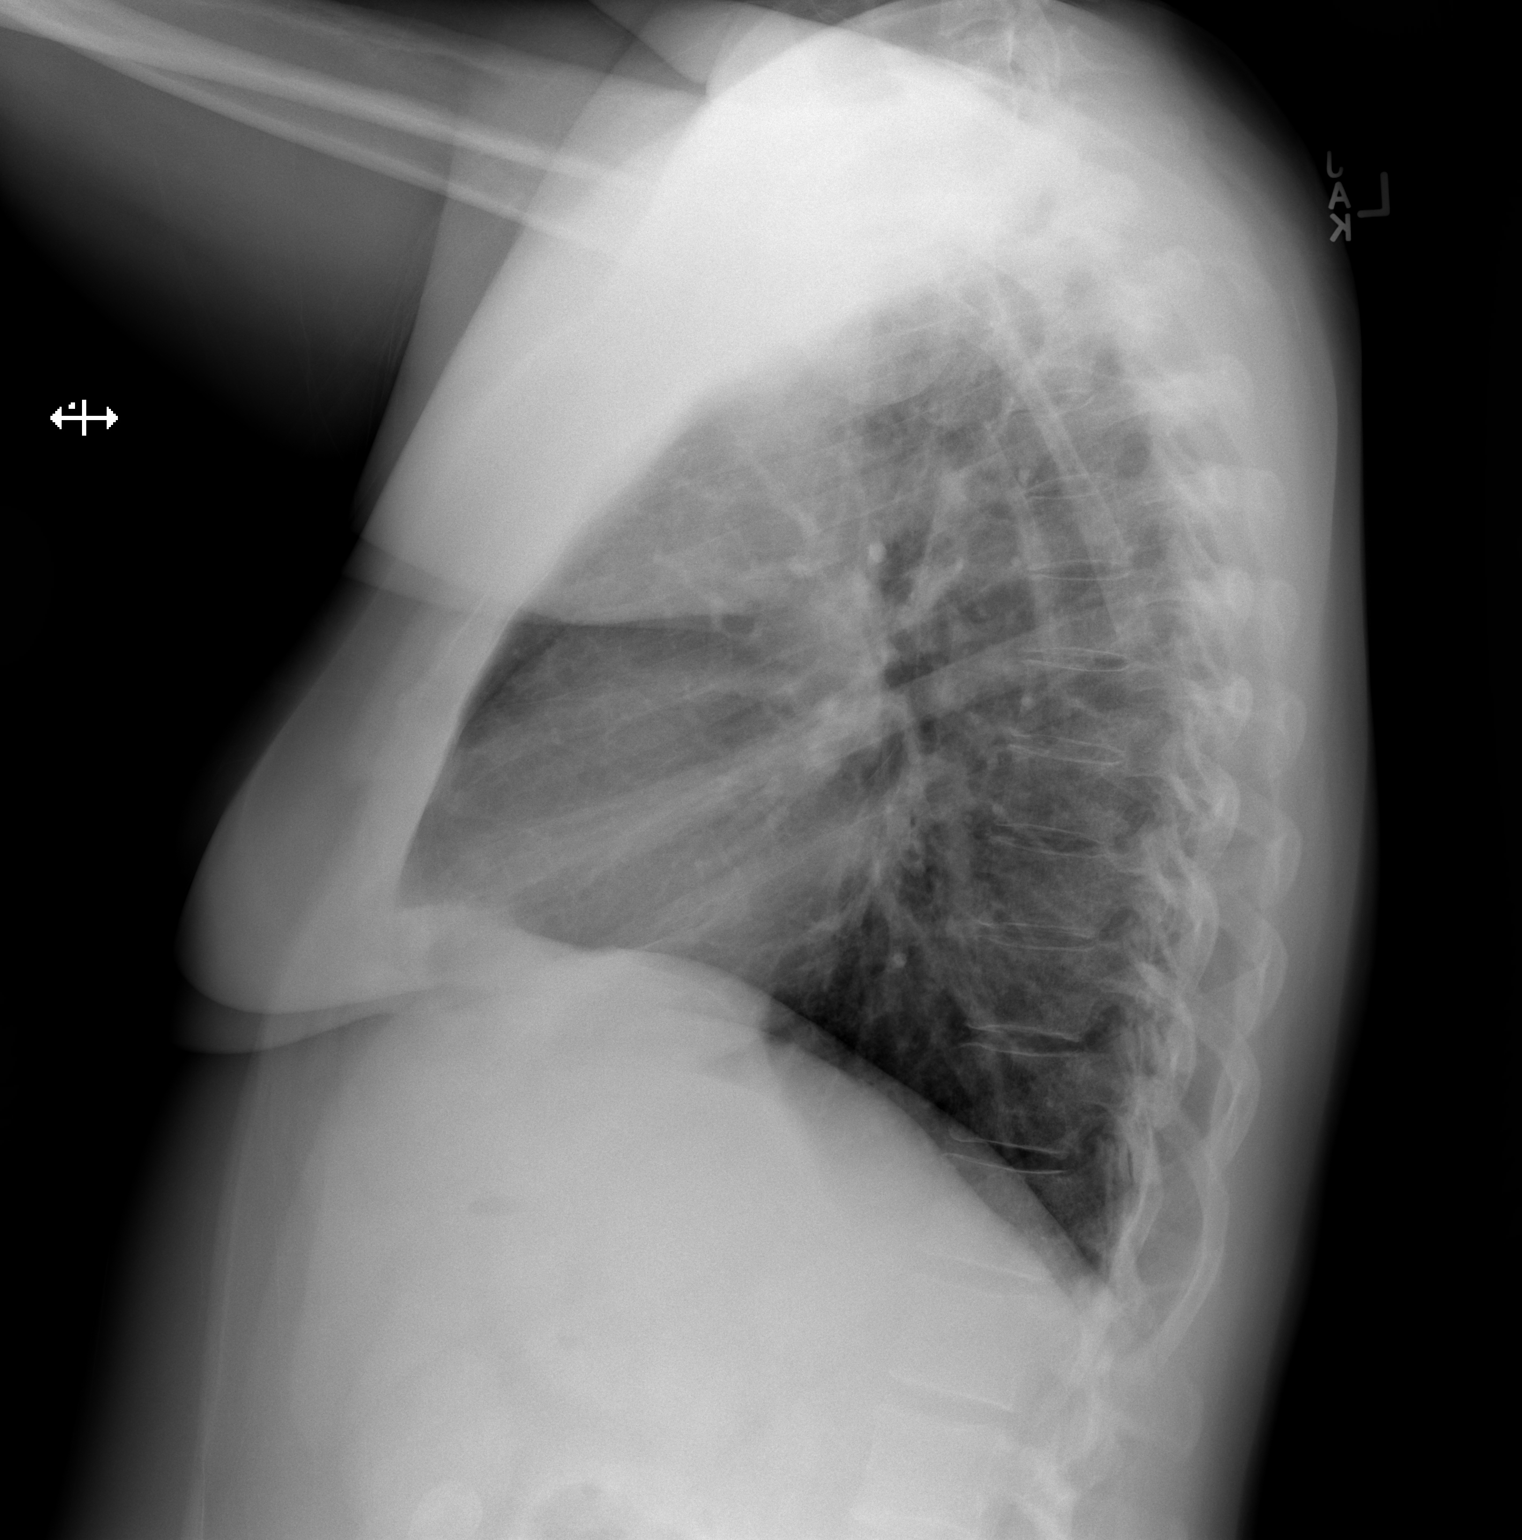

[2 of 2 positions shown; findings below may reference images not displayed]

FINDINGS: No consolidation, features of edema, pneumothorax, or effusion.
Pulmonary vascularity is normally distributed. The cardiomediastinal
contours are unremarkable. No acute osseous or soft tissue
abnormality.
IMPRESSION: No acute cardiopulmonary abnormality.
